# Patient Record
Sex: Female | Born: 1935 | Hispanic: Yes | Marital: Married | Smoking: Never smoker
Health system: Southern US, Community
[De-identification: ages and names within clinical notes are randomized; demographics above are authoritative.]

## PROBLEM LIST (undated history)

## (undated) DIAGNOSIS — M810 Age-related osteoporosis without current pathological fracture: Secondary | ICD-10-CM

## (undated) DIAGNOSIS — C50919 Malignant neoplasm of unspecified site of unspecified female breast: Secondary | ICD-10-CM

## (undated) DIAGNOSIS — M1990 Unspecified osteoarthritis, unspecified site: Secondary | ICD-10-CM

## (undated) DIAGNOSIS — I10 Essential (primary) hypertension: Secondary | ICD-10-CM

## (undated) HISTORY — DX: Age-related osteoporosis without current pathological fracture: M81.0

## (undated) HISTORY — DX: Unspecified osteoarthritis, unspecified site: M19.90

## (undated) HISTORY — DX: Malignant neoplasm of unspecified site of unspecified female breast: C50.919

---

## 2006-04-19 ENCOUNTER — Encounter: Admission: RE | Admit: 2006-04-19 | Discharge: 2006-04-19 | Payer: Self-pay | Admitting: Orthopedic Surgery

## 2007-03-24 ENCOUNTER — Other Ambulatory Visit: Admission: RE | Admit: 2007-03-24 | Discharge: 2007-03-24 | Payer: Self-pay | Admitting: Internal Medicine

## 2012-01-15 DIAGNOSIS — I10 Essential (primary) hypertension: Secondary | ICD-10-CM | POA: Diagnosis not present

## 2012-01-15 DIAGNOSIS — R5381 Other malaise: Secondary | ICD-10-CM | POA: Diagnosis not present

## 2012-01-15 DIAGNOSIS — R5383 Other fatigue: Secondary | ICD-10-CM | POA: Diagnosis not present

## 2012-01-22 DIAGNOSIS — M159 Polyosteoarthritis, unspecified: Secondary | ICD-10-CM | POA: Diagnosis not present

## 2012-01-22 DIAGNOSIS — I10 Essential (primary) hypertension: Secondary | ICD-10-CM | POA: Diagnosis not present

## 2012-01-22 DIAGNOSIS — M810 Age-related osteoporosis without current pathological fracture: Secondary | ICD-10-CM | POA: Diagnosis not present

## 2012-01-22 DIAGNOSIS — R5381 Other malaise: Secondary | ICD-10-CM | POA: Diagnosis not present

## 2012-04-25 DIAGNOSIS — H2510 Age-related nuclear cataract, unspecified eye: Secondary | ICD-10-CM | POA: Diagnosis not present

## 2012-04-25 DIAGNOSIS — H35039 Hypertensive retinopathy, unspecified eye: Secondary | ICD-10-CM | POA: Diagnosis not present

## 2012-04-25 DIAGNOSIS — H353190 Nonexudative age-related macular degeneration, unspecified eye, stage unspecified: Secondary | ICD-10-CM | POA: Diagnosis not present

## 2012-04-25 DIAGNOSIS — H43819 Vitreous degeneration, unspecified eye: Secondary | ICD-10-CM | POA: Diagnosis not present

## 2012-04-25 DIAGNOSIS — H40019 Open angle with borderline findings, low risk, unspecified eye: Secondary | ICD-10-CM | POA: Diagnosis not present

## 2012-07-15 DIAGNOSIS — M810 Age-related osteoporosis without current pathological fracture: Secondary | ICD-10-CM | POA: Diagnosis not present

## 2012-07-15 DIAGNOSIS — Z79899 Other long term (current) drug therapy: Secondary | ICD-10-CM | POA: Diagnosis not present

## 2012-07-15 DIAGNOSIS — R5383 Other fatigue: Secondary | ICD-10-CM | POA: Diagnosis not present

## 2012-07-15 DIAGNOSIS — R5381 Other malaise: Secondary | ICD-10-CM | POA: Diagnosis not present

## 2012-07-15 DIAGNOSIS — I10 Essential (primary) hypertension: Secondary | ICD-10-CM | POA: Diagnosis not present

## 2012-07-15 DIAGNOSIS — M159 Polyosteoarthritis, unspecified: Secondary | ICD-10-CM | POA: Diagnosis not present

## 2012-07-22 DIAGNOSIS — M810 Age-related osteoporosis without current pathological fracture: Secondary | ICD-10-CM | POA: Diagnosis not present

## 2012-07-22 DIAGNOSIS — Z780 Asymptomatic menopausal state: Secondary | ICD-10-CM | POA: Diagnosis not present

## 2012-07-22 DIAGNOSIS — M159 Polyosteoarthritis, unspecified: Secondary | ICD-10-CM | POA: Diagnosis not present

## 2012-07-22 DIAGNOSIS — I10 Essential (primary) hypertension: Secondary | ICD-10-CM | POA: Diagnosis not present

## 2012-07-22 DIAGNOSIS — H908 Mixed conductive and sensorineural hearing loss, unspecified: Secondary | ICD-10-CM | POA: Diagnosis not present

## 2012-08-13 DIAGNOSIS — Z1212 Encounter for screening for malignant neoplasm of rectum: Secondary | ICD-10-CM | POA: Diagnosis not present

## 2013-04-17 DIAGNOSIS — H40019 Open angle with borderline findings, low risk, unspecified eye: Secondary | ICD-10-CM | POA: Diagnosis not present

## 2013-04-17 DIAGNOSIS — H35039 Hypertensive retinopathy, unspecified eye: Secondary | ICD-10-CM | POA: Diagnosis not present

## 2013-04-17 DIAGNOSIS — H353190 Nonexudative age-related macular degeneration, unspecified eye, stage unspecified: Secondary | ICD-10-CM | POA: Diagnosis not present

## 2013-04-17 DIAGNOSIS — H2510 Age-related nuclear cataract, unspecified eye: Secondary | ICD-10-CM | POA: Diagnosis not present

## 2013-07-15 DIAGNOSIS — H04129 Dry eye syndrome of unspecified lacrimal gland: Secondary | ICD-10-CM | POA: Diagnosis not present

## 2013-07-15 DIAGNOSIS — H40019 Open angle with borderline findings, low risk, unspecified eye: Secondary | ICD-10-CM | POA: Diagnosis not present

## 2013-07-21 DIAGNOSIS — I10 Essential (primary) hypertension: Secondary | ICD-10-CM | POA: Diagnosis not present

## 2013-07-21 DIAGNOSIS — M159 Polyosteoarthritis, unspecified: Secondary | ICD-10-CM | POA: Diagnosis not present

## 2013-07-21 DIAGNOSIS — M899 Disorder of bone, unspecified: Secondary | ICD-10-CM | POA: Diagnosis not present

## 2013-07-21 DIAGNOSIS — M810 Age-related osteoporosis without current pathological fracture: Secondary | ICD-10-CM | POA: Diagnosis not present

## 2013-07-21 DIAGNOSIS — Z0000 Encounter for general adult medical examination without abnormal findings: Secondary | ICD-10-CM | POA: Diagnosis not present

## 2013-07-28 DIAGNOSIS — M810 Age-related osteoporosis without current pathological fracture: Secondary | ICD-10-CM | POA: Diagnosis not present

## 2013-07-28 DIAGNOSIS — I10 Essential (primary) hypertension: Secondary | ICD-10-CM | POA: Diagnosis not present

## 2013-07-28 DIAGNOSIS — M159 Polyosteoarthritis, unspecified: Secondary | ICD-10-CM | POA: Diagnosis not present

## 2013-07-28 DIAGNOSIS — R5381 Other malaise: Secondary | ICD-10-CM | POA: Diagnosis not present

## 2014-08-03 DIAGNOSIS — I10 Essential (primary) hypertension: Secondary | ICD-10-CM | POA: Diagnosis not present

## 2014-08-03 DIAGNOSIS — Z0000 Encounter for general adult medical examination without abnormal findings: Secondary | ICD-10-CM | POA: Diagnosis not present

## 2014-08-03 DIAGNOSIS — Z1389 Encounter for screening for other disorder: Secondary | ICD-10-CM | POA: Diagnosis not present

## 2014-08-03 DIAGNOSIS — R5383 Other fatigue: Secondary | ICD-10-CM | POA: Diagnosis not present

## 2014-08-10 DIAGNOSIS — I10 Essential (primary) hypertension: Secondary | ICD-10-CM | POA: Diagnosis not present

## 2014-08-10 DIAGNOSIS — M150 Primary generalized (osteo)arthritis: Secondary | ICD-10-CM | POA: Diagnosis not present

## 2014-08-10 DIAGNOSIS — Z780 Asymptomatic menopausal state: Secondary | ICD-10-CM | POA: Diagnosis not present

## 2014-08-10 DIAGNOSIS — N183 Chronic kidney disease, stage 3 (moderate): Secondary | ICD-10-CM | POA: Diagnosis not present

## 2014-11-08 DIAGNOSIS — H25011 Cortical age-related cataract, right eye: Secondary | ICD-10-CM | POA: Diagnosis not present

## 2014-11-08 DIAGNOSIS — H2512 Age-related nuclear cataract, left eye: Secondary | ICD-10-CM | POA: Diagnosis not present

## 2014-11-08 DIAGNOSIS — H2511 Age-related nuclear cataract, right eye: Secondary | ICD-10-CM | POA: Diagnosis not present

## 2014-11-08 DIAGNOSIS — H3531 Nonexudative age-related macular degeneration: Secondary | ICD-10-CM | POA: Diagnosis not present

## 2014-11-08 DIAGNOSIS — H40013 Open angle with borderline findings, low risk, bilateral: Secondary | ICD-10-CM | POA: Diagnosis not present

## 2015-05-11 DIAGNOSIS — H40013 Open angle with borderline findings, low risk, bilateral: Secondary | ICD-10-CM | POA: Diagnosis not present

## 2015-09-06 DIAGNOSIS — I10 Essential (primary) hypertension: Secondary | ICD-10-CM | POA: Diagnosis not present

## 2015-09-06 DIAGNOSIS — N183 Chronic kidney disease, stage 3 (moderate): Secondary | ICD-10-CM | POA: Diagnosis not present

## 2015-09-06 DIAGNOSIS — M150 Primary generalized (osteo)arthritis: Secondary | ICD-10-CM | POA: Diagnosis not present

## 2015-09-13 DIAGNOSIS — I10 Essential (primary) hypertension: Secondary | ICD-10-CM | POA: Diagnosis not present

## 2015-09-13 DIAGNOSIS — N183 Chronic kidney disease, stage 3 (moderate): Secondary | ICD-10-CM | POA: Diagnosis not present

## 2015-09-13 DIAGNOSIS — M810 Age-related osteoporosis without current pathological fracture: Secondary | ICD-10-CM | POA: Diagnosis not present

## 2015-09-13 DIAGNOSIS — M150 Primary generalized (osteo)arthritis: Secondary | ICD-10-CM | POA: Diagnosis not present

## 2016-01-17 DIAGNOSIS — H04123 Dry eye syndrome of bilateral lacrimal glands: Secondary | ICD-10-CM | POA: Diagnosis not present

## 2016-01-17 DIAGNOSIS — H353110 Nonexudative age-related macular degeneration, right eye, stage unspecified: Secondary | ICD-10-CM | POA: Diagnosis not present

## 2016-01-17 DIAGNOSIS — H353120 Nonexudative age-related macular degeneration, left eye, stage unspecified: Secondary | ICD-10-CM | POA: Diagnosis not present

## 2016-01-17 DIAGNOSIS — H40013 Open angle with borderline findings, low risk, bilateral: Secondary | ICD-10-CM | POA: Diagnosis not present

## 2016-10-05 ENCOUNTER — Emergency Department (HOSPITAL_COMMUNITY)
Admission: EM | Admit: 2016-10-05 | Discharge: 2016-10-05 | Disposition: A | Discharge status: Home / Self Care | Payer: Medicare Other | Attending: Emergency Medicine | Admitting: Emergency Medicine

## 2016-10-05 ENCOUNTER — Encounter (HOSPITAL_COMMUNITY): Payer: Self-pay | Admitting: *Deleted

## 2016-10-05 DIAGNOSIS — I10 Essential (primary) hypertension: Secondary | ICD-10-CM | POA: Diagnosis not present

## 2016-10-05 DIAGNOSIS — M545 Low back pain: Secondary | ICD-10-CM | POA: Diagnosis not present

## 2016-10-05 DIAGNOSIS — M2550 Pain in unspecified joint: Secondary | ICD-10-CM | POA: Diagnosis not present

## 2016-10-05 DIAGNOSIS — M79601 Pain in right arm: Secondary | ICD-10-CM | POA: Insufficient documentation

## 2016-10-05 DIAGNOSIS — M791 Myalgia: Secondary | ICD-10-CM | POA: Insufficient documentation

## 2016-10-05 HISTORY — DX: Essential (primary) hypertension: I10

## 2016-10-05 LAB — URINALYSIS, ROUTINE W REFLEX MICROSCOPIC
BILIRUBIN URINE: NEGATIVE
Glucose, UA: NEGATIVE mg/dL
HGB URINE DIPSTICK: NEGATIVE
Ketones, ur: NEGATIVE mg/dL
Leukocytes, UA: NEGATIVE
Nitrite: NEGATIVE
PH: 5 (ref 5.0–8.0)
Protein, ur: NEGATIVE mg/dL
SPECIFIC GRAVITY, URINE: 1.014 (ref 1.005–1.030)

## 2016-10-05 LAB — I-STAT CHEM 8, ED
BUN: 22 mg/dL — AB (ref 6–20)
CALCIUM ION: 1.32 mmol/L (ref 1.15–1.40)
CHLORIDE: 104 mmol/L (ref 101–111)
CREATININE: 0.9 mg/dL (ref 0.44–1.00)
Glucose, Bld: 116 mg/dL — ABNORMAL HIGH (ref 65–99)
HCT: 33 % — ABNORMAL LOW (ref 36.0–46.0)
Hemoglobin: 11.2 g/dL — ABNORMAL LOW (ref 12.0–15.0)
Potassium: 3.9 mmol/L (ref 3.5–5.1)
SODIUM: 138 mmol/L (ref 135–145)
TCO2: 23 mmol/L (ref 0–100)

## 2016-10-05 LAB — CK: Total CK: 98 U/L (ref 38–234)

## 2016-10-05 MED ORDER — TRAMADOL HCL 50 MG PO TABS: 50.0000 mg | ORAL_TABLET | Freq: Four times a day (QID) | ORAL | 0 refills | Status: DC | PRN

## 2016-10-05 MED ORDER — TRAMADOL HCL 50 MG PO TABS
50.0000 mg | ORAL_TABLET | Freq: Once | ORAL | Status: AC
  Administered 2016-10-05: 50 mg via ORAL
  Filled 2016-10-05: qty 1

## 2016-10-05 NOTE — ED Notes (Signed)
Patient requesting pain medication now. Made Dr Ethelda Chick aware. Awaiting new orders.

## 2016-10-05 NOTE — Discharge Instructions (Signed)
Take Tylenol for mild pain or the pain medicine prescribed for bad pain. Keep scheduled appointment with Dr.Ramachandran for 10/15/2016

## 2016-10-05 NOTE — ED Provider Notes (Signed)
[facility]-EMERGENCY DEPT Provider Note   CSN: [id number] Arrival date & time: 10/05/16  1527     History   Chief Complaint Chief Complaint  Patient presents with  . Arm Pain  . Leg Pain  . Back Pain    HPI Katelyn Fitzgerald is a 80 y.o. female.  HPI complains of pain of her entire right arm, low back pain and bilateral hip pain since mid November 2017. She is treated herself with Aspercreme and with Tylenol without adequate pain relief. She denies fever denies trauma denies chest pain denies shortness of breath denies other associated symptoms. She's never had similar symptoms before. Pain is worse with changing positions and improved with remaining still. No other associated symptoms  Past Medical History:  Diagnosis Date  . Hypertension     There are no active problems to display for this patient.   History reviewed. No pertinent surgical history.  OB History    No data available       Home Medications    Prior to Admission medications   Not on File    Family History No family history on file.  Social History Social History  Substance Use Topics  . Smoking status: Never Smoker  . Smokeless tobacco: Never Used  . Alcohol use No     Allergies   Patient has no allergy information on record.   Review of Systems Review of Systems  Constitutional: Negative.   HENT: Negative.   Respiratory: Negative.   Cardiovascular: Negative.   Gastrointestinal: Negative.   Musculoskeletal: Positive for arthralgias and myalgias.  Skin: Negative.   Neurological: Negative.   Psychiatric/Behavioral: Negative.   All other systems reviewed and are negative.    Physical Exam Updated Vital Signs BP 153/76   Pulse 95   Temp 98.2 F (36.8 C) (Oral)   Resp 16   SpO2 96%   Physical Exam  Constitutional: She appears well-developed and well-nourished.  HENT:  Head: Normocephalic and atraumatic.  Eyes: Conjunctivae are normal. Pupils are equal, round, and reactive to  light.  Neck: Neck supple. No tracheal deviation present. No thyromegaly present.  Cardiovascular: Normal rate and regular rhythm.   No murmur heard. Pulmonary/Chest: Effort normal and breath sounds normal.  Abdominal: Soft. Bowel sounds are normal. She exhibits no distension. There is no tenderness.  Musculoskeletal: Normal range of motion. She exhibits no edema or tenderness.  Tire spine is nontender. All 4 extremities without redness swelling or tenderness. Full range of motion neurovascularly intact  Neurological: She is alert. Coordination normal.  Gait normal motor strength 5 over 5 overall  Skin: Skin is warm and dry. No rash noted.  Psychiatric: She has a normal mood and affect.  Nursing note and vitals reviewed.    ED Treatments / Results  Labs (all labs ordered are listed, but only abnormal results are displayed) Labs Reviewed  URINALYSIS, ROUTINE W REFLEX MICROSCOPIC    EKG  EKG Interpretation None      Results for orders placed or performed during the hospital encounter of 10/05/16  Urinalysis, Routine w reflex microscopic- may I&O cath if menses  Result Value Ref Range   Color, Urine YELLOW YELLOW   APPearance CLEAR CLEAR   Specific Gravity, Urine 1.014 1.005 - 1.030   pH 5.0 5.0 - 8.0   Glucose, UA NEGATIVE NEGATIVE mg/dL   Hgb urine dipstick NEGATIVE NEGATIVE   Bilirubin Urine NEGATIVE NEGATIVE   Ketones, ur NEGATIVE NEGATIVE mg/dL   Protein, ur NEGATIVE NEGATIVE mg/dL  Nitrite NEGATIVE NEGATIVE   Leukocytes, UA NEGATIVE NEGATIVE  CK  Result Value Ref Range   Total CK 98 38 - 234 U/L  I-stat chem 8, ed  Result Value Ref Range   Sodium 138 135 - 145 mmol/L   Potassium 3.9 3.5 - 5.1 mmol/L   Chloride 104 101 - 111 mmol/L   BUN 22 (H) 6 - 20 mg/dL   Creatinine, Ser 8.65 0.44 - 1.00 mg/dL   Glucose, Bld 784 (H) 65 - 99 mg/dL   Calcium, Ion 6.96 2.95 - 1.40 mmol/L   TCO2 23 0 - 100 mmol/L   Hemoglobin 11.2 (L) 12.0 - 15.0 g/dL   HCT 28.4 (L) 13.2 -  46.0 %   No results found.  Radiology No results found.  Procedures Procedures (including critical care time)  Medications Ordered in ED Medications - No data to display   Initial Impression / Assessment and Plan / ED Course  I have reviewed the triage vital signs and the nursing notes.  Pertinent labs & imaging results that were available during my care of the patient were reviewed by me and considered in my medical decision making (see chart for details).  Clinical Course   8:10 PM she feels improved after treatment with tramadol. [place] controlled substance reporting system queried Plan prescription tramadol. She is to keep scheduled appointment with Dr.Ramachondron 10/15/2006  Final Clinical Impressions(s) / ED Diagnoses  Diagnosis #1 arthralgias #2 myalgias Final diagnoses:  None    New Prescriptions New Prescriptions   No medications on file     Doug Sou, MD 10/05/16 2017

## 2016-10-05 NOTE — ED Notes (Signed)
Patient requesting pharmacy tech to come enter medications to make sure nothing she takes will interfere with new pain medication ordered before she takes it.   Made Pharmacy techs aware.

## 2016-10-05 NOTE — ED Triage Notes (Signed)
Pt complains of right arm pain, lower back pain, and bilateral knee pain for the past month. Pt denies injury, states pain gradually became worse.

## 2016-10-09 DIAGNOSIS — D649 Anemia, unspecified: Secondary | ICD-10-CM | POA: Diagnosis not present

## 2016-10-09 DIAGNOSIS — I10 Essential (primary) hypertension: Secondary | ICD-10-CM | POA: Diagnosis not present

## 2016-10-09 DIAGNOSIS — N183 Chronic kidney disease, stage 3 (moderate): Secondary | ICD-10-CM | POA: Diagnosis not present

## 2016-10-09 DIAGNOSIS — M150 Primary generalized (osteo)arthritis: Secondary | ICD-10-CM | POA: Diagnosis not present

## 2016-10-12 DIAGNOSIS — D509 Iron deficiency anemia, unspecified: Secondary | ICD-10-CM | POA: Diagnosis not present

## 2016-10-12 DIAGNOSIS — D649 Anemia, unspecified: Secondary | ICD-10-CM | POA: Diagnosis not present

## 2016-10-15 ENCOUNTER — Other Ambulatory Visit: Payer: Self-pay | Admitting: Internal Medicine

## 2016-10-15 DIAGNOSIS — I10 Essential (primary) hypertension: Secondary | ICD-10-CM | POA: Diagnosis not present

## 2016-10-15 DIAGNOSIS — I63132 Cerebral infarction due to embolism of left carotid artery: Secondary | ICD-10-CM

## 2016-10-15 DIAGNOSIS — M25511 Pain in right shoulder: Secondary | ICD-10-CM | POA: Diagnosis not present

## 2016-10-15 DIAGNOSIS — D649 Anemia, unspecified: Secondary | ICD-10-CM | POA: Diagnosis not present

## 2016-10-15 DIAGNOSIS — N183 Chronic kidney disease, stage 3 (moderate): Secondary | ICD-10-CM | POA: Diagnosis not present

## 2016-10-16 ENCOUNTER — Ambulatory Visit
Admission: RE | Admit: 2016-10-16 | Discharge: 2016-10-16 | Disposition: A | Discharge status: Home / Self Care | Payer: Medicare Other | Source: Ambulatory Visit | Attending: Internal Medicine | Admitting: Internal Medicine

## 2016-10-16 DIAGNOSIS — I63132 Cerebral infarction due to embolism of left carotid artery: Secondary | ICD-10-CM

## 2016-10-16 DIAGNOSIS — I639 Cerebral infarction, unspecified: Secondary | ICD-10-CM | POA: Diagnosis not present

## 2016-10-22 DIAGNOSIS — Z1212 Encounter for screening for malignant neoplasm of rectum: Secondary | ICD-10-CM | POA: Diagnosis not present

## 2016-10-30 DIAGNOSIS — M25569 Pain in unspecified knee: Secondary | ICD-10-CM | POA: Diagnosis not present

## 2016-10-30 DIAGNOSIS — M1711 Unilateral primary osteoarthritis, right knee: Secondary | ICD-10-CM | POA: Diagnosis not present

## 2016-10-30 DIAGNOSIS — M1712 Unilateral primary osteoarthritis, left knee: Secondary | ICD-10-CM | POA: Diagnosis not present

## 2016-10-30 DIAGNOSIS — M25511 Pain in right shoulder: Secondary | ICD-10-CM | POA: Diagnosis not present

## 2016-10-30 DIAGNOSIS — D649 Anemia, unspecified: Secondary | ICD-10-CM | POA: Diagnosis not present

## 2016-10-31 DIAGNOSIS — M25569 Pain in unspecified knee: Secondary | ICD-10-CM | POA: Diagnosis not present

## 2016-11-14 DIAGNOSIS — M2550 Pain in unspecified joint: Secondary | ICD-10-CM | POA: Diagnosis not present

## 2016-11-22 DIAGNOSIS — M150 Primary generalized (osteo)arthritis: Secondary | ICD-10-CM | POA: Diagnosis not present

## 2016-11-22 DIAGNOSIS — R768 Other specified abnormal immunological findings in serum: Secondary | ICD-10-CM | POA: Diagnosis not present

## 2016-11-26 DIAGNOSIS — H04123 Dry eye syndrome of bilateral lacrimal glands: Secondary | ICD-10-CM | POA: Diagnosis not present

## 2016-11-26 DIAGNOSIS — H40013 Open angle with borderline findings, low risk, bilateral: Secondary | ICD-10-CM | POA: Diagnosis not present

## 2016-12-26 DIAGNOSIS — M064 Inflammatory polyarthropathy: Secondary | ICD-10-CM | POA: Diagnosis not present

## 2016-12-26 DIAGNOSIS — Z13828 Encounter for screening for other musculoskeletal disorder: Secondary | ICD-10-CM | POA: Diagnosis not present

## 2016-12-26 DIAGNOSIS — R768 Other specified abnormal immunological findings in serum: Secondary | ICD-10-CM | POA: Diagnosis not present

## 2016-12-26 DIAGNOSIS — M1990 Unspecified osteoarthritis, unspecified site: Secondary | ICD-10-CM | POA: Diagnosis not present

## 2016-12-26 DIAGNOSIS — M79642 Pain in left hand: Secondary | ICD-10-CM | POA: Diagnosis not present

## 2016-12-26 DIAGNOSIS — M79641 Pain in right hand: Secondary | ICD-10-CM | POA: Diagnosis not present

## 2016-12-26 DIAGNOSIS — M810 Age-related osteoporosis without current pathological fracture: Secondary | ICD-10-CM | POA: Diagnosis not present

## 2017-02-04 DIAGNOSIS — M810 Age-related osteoporosis without current pathological fracture: Secondary | ICD-10-CM | POA: Diagnosis not present

## 2017-02-04 DIAGNOSIS — M1990 Unspecified osteoarthritis, unspecified site: Secondary | ICD-10-CM | POA: Diagnosis not present

## 2017-02-04 DIAGNOSIS — R768 Other specified abnormal immunological findings in serum: Secondary | ICD-10-CM | POA: Diagnosis not present

## 2017-02-04 DIAGNOSIS — M064 Inflammatory polyarthropathy: Secondary | ICD-10-CM | POA: Diagnosis not present

## 2017-06-19 DIAGNOSIS — R768 Other specified abnormal immunological findings in serum: Secondary | ICD-10-CM | POA: Diagnosis not present

## 2017-06-19 DIAGNOSIS — M810 Age-related osteoporosis without current pathological fracture: Secondary | ICD-10-CM | POA: Diagnosis not present

## 2017-06-19 DIAGNOSIS — M1990 Unspecified osteoarthritis, unspecified site: Secondary | ICD-10-CM | POA: Diagnosis not present

## 2017-06-19 DIAGNOSIS — M064 Inflammatory polyarthropathy: Secondary | ICD-10-CM | POA: Diagnosis not present

## 2017-07-29 DIAGNOSIS — H353131 Nonexudative age-related macular degeneration, bilateral, early dry stage: Secondary | ICD-10-CM | POA: Diagnosis not present

## 2017-07-29 DIAGNOSIS — H25013 Cortical age-related cataract, bilateral: Secondary | ICD-10-CM | POA: Diagnosis not present

## 2017-07-29 DIAGNOSIS — H40013 Open angle with borderline findings, low risk, bilateral: Secondary | ICD-10-CM | POA: Diagnosis not present

## 2017-07-29 DIAGNOSIS — H2513 Age-related nuclear cataract, bilateral: Secondary | ICD-10-CM | POA: Diagnosis not present

## 2017-08-12 DIAGNOSIS — M810 Age-related osteoporosis without current pathological fracture: Secondary | ICD-10-CM | POA: Diagnosis not present

## 2017-09-24 DIAGNOSIS — M064 Inflammatory polyarthropathy: Secondary | ICD-10-CM | POA: Diagnosis not present

## 2017-09-24 DIAGNOSIS — R768 Other specified abnormal immunological findings in serum: Secondary | ICD-10-CM | POA: Diagnosis not present

## 2017-09-24 DIAGNOSIS — M810 Age-related osteoporosis without current pathological fracture: Secondary | ICD-10-CM | POA: Diagnosis not present

## 2017-09-24 DIAGNOSIS — M1990 Unspecified osteoarthritis, unspecified site: Secondary | ICD-10-CM | POA: Diagnosis not present

## 2017-09-24 DIAGNOSIS — M069 Rheumatoid arthritis, unspecified: Secondary | ICD-10-CM | POA: Diagnosis not present

## 2017-10-21 DIAGNOSIS — N183 Chronic kidney disease, stage 3 (moderate): Secondary | ICD-10-CM | POA: Diagnosis not present

## 2017-10-21 DIAGNOSIS — I10 Essential (primary) hypertension: Secondary | ICD-10-CM | POA: Diagnosis not present

## 2017-10-28 DIAGNOSIS — Z0000 Encounter for general adult medical examination without abnormal findings: Secondary | ICD-10-CM | POA: Diagnosis not present

## 2017-10-28 DIAGNOSIS — M810 Age-related osteoporosis without current pathological fracture: Secondary | ICD-10-CM | POA: Diagnosis not present

## 2017-10-28 DIAGNOSIS — I10 Essential (primary) hypertension: Secondary | ICD-10-CM | POA: Diagnosis not present

## 2017-10-28 DIAGNOSIS — M150 Primary generalized (osteo)arthritis: Secondary | ICD-10-CM | POA: Diagnosis not present

## 2017-10-28 DIAGNOSIS — M069 Rheumatoid arthritis, unspecified: Secondary | ICD-10-CM | POA: Diagnosis not present

## 2017-10-28 DIAGNOSIS — N183 Chronic kidney disease, stage 3 (moderate): Secondary | ICD-10-CM | POA: Diagnosis not present

## 2017-12-25 DIAGNOSIS — M069 Rheumatoid arthritis, unspecified: Secondary | ICD-10-CM | POA: Diagnosis not present

## 2017-12-25 DIAGNOSIS — M1990 Unspecified osteoarthritis, unspecified site: Secondary | ICD-10-CM | POA: Diagnosis not present

## 2017-12-25 DIAGNOSIS — M810 Age-related osteoporosis without current pathological fracture: Secondary | ICD-10-CM | POA: Diagnosis not present

## 2017-12-25 DIAGNOSIS — Z79899 Other long term (current) drug therapy: Secondary | ICD-10-CM | POA: Diagnosis not present

## 2017-12-25 DIAGNOSIS — N289 Disorder of kidney and ureter, unspecified: Secondary | ICD-10-CM | POA: Diagnosis not present

## 2017-12-25 DIAGNOSIS — M25512 Pain in left shoulder: Secondary | ICD-10-CM | POA: Diagnosis not present

## 2018-01-21 DIAGNOSIS — H40013 Open angle with borderline findings, low risk, bilateral: Secondary | ICD-10-CM | POA: Diagnosis not present

## 2018-02-13 DIAGNOSIS — M810 Age-related osteoporosis without current pathological fracture: Secondary | ICD-10-CM | POA: Diagnosis not present

## 2018-02-13 DIAGNOSIS — M069 Rheumatoid arthritis, unspecified: Secondary | ICD-10-CM | POA: Diagnosis not present

## 2018-03-25 DIAGNOSIS — N289 Disorder of kidney and ureter, unspecified: Secondary | ICD-10-CM | POA: Diagnosis not present

## 2018-03-25 DIAGNOSIS — M25512 Pain in left shoulder: Secondary | ICD-10-CM | POA: Diagnosis not present

## 2018-03-25 DIAGNOSIS — M1990 Unspecified osteoarthritis, unspecified site: Secondary | ICD-10-CM | POA: Diagnosis not present

## 2018-03-25 DIAGNOSIS — Z79899 Other long term (current) drug therapy: Secondary | ICD-10-CM | POA: Diagnosis not present

## 2018-03-25 DIAGNOSIS — M069 Rheumatoid arthritis, unspecified: Secondary | ICD-10-CM | POA: Diagnosis not present

## 2018-03-25 DIAGNOSIS — M810 Age-related osteoporosis without current pathological fracture: Secondary | ICD-10-CM | POA: Diagnosis not present

## 2018-05-13 DIAGNOSIS — Z79899 Other long term (current) drug therapy: Secondary | ICD-10-CM | POA: Diagnosis not present

## 2018-05-13 DIAGNOSIS — M069 Rheumatoid arthritis, unspecified: Secondary | ICD-10-CM | POA: Diagnosis not present

## 2018-06-18 IMAGING — CT CT HEAD W/O CM
3 of 4 series · 17 of 47 positions shown, 20 images · non-contrast
Comparison: None.

CLINICAL DATA: Cerebral infarction due to embolism left carotid
artery.

EXAM:
CT HEAD WITHOUT CONTRAST
TECHNIQUE: Contiguous axial images were obtained from the base of the skull
through the vertex without intravenous contrast.

[Series 32: 3d filtered head w/o · axial · non-contrast · 0.49mm/px · z∈[-25,+105]mm · 11 of 32 slices shown, 14 images]
[im 3/32  brain]
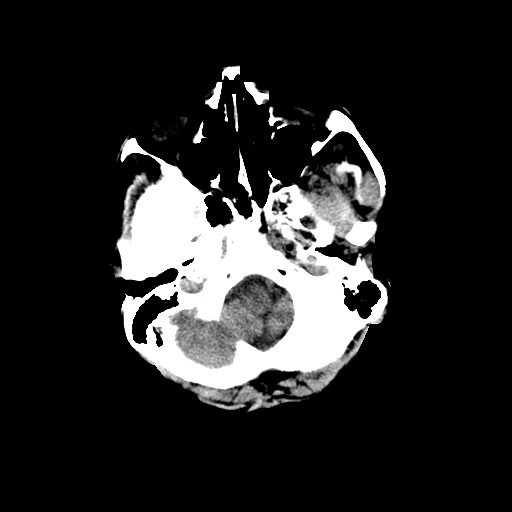
[im 3/32  bone]
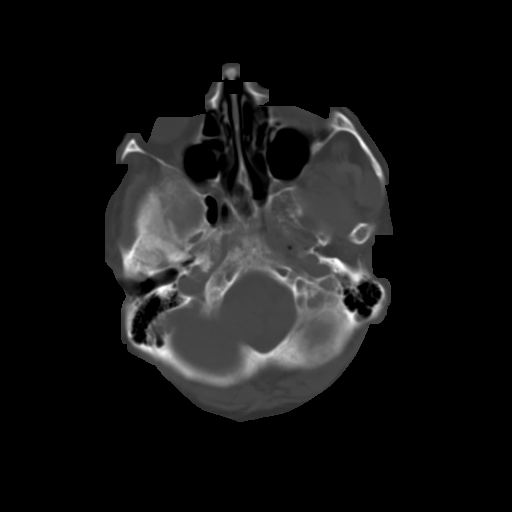
[im 5/32  brain]
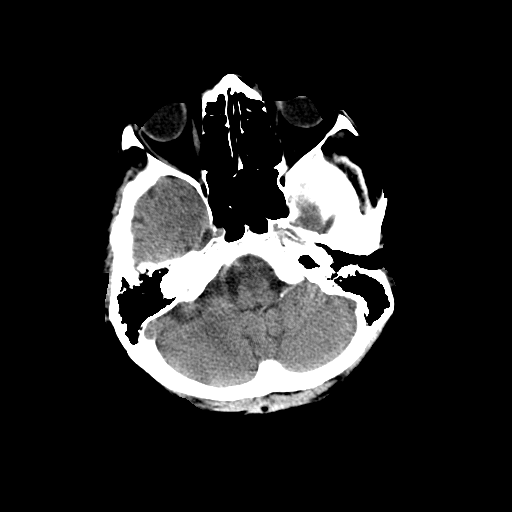
[im 7/32  brain]
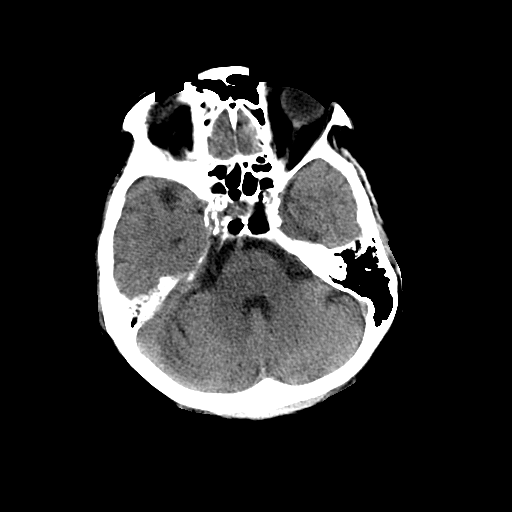
[im 12/32  brain]
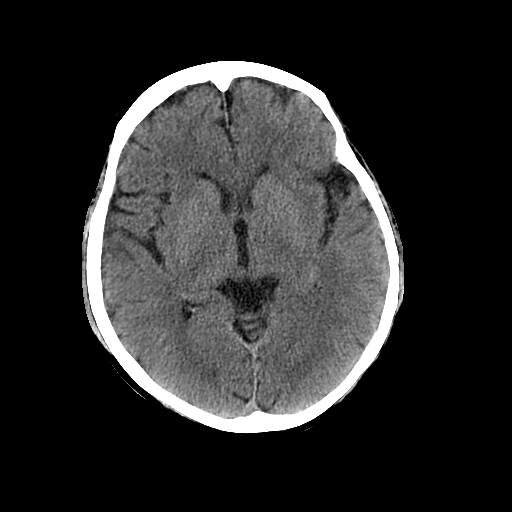
[im 14/32  brain]
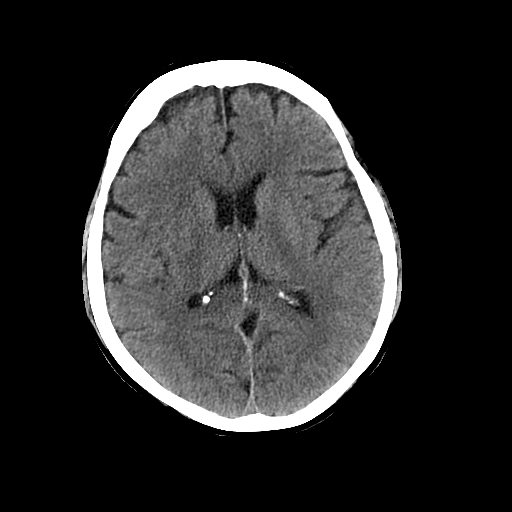
[im 14/32  bone]
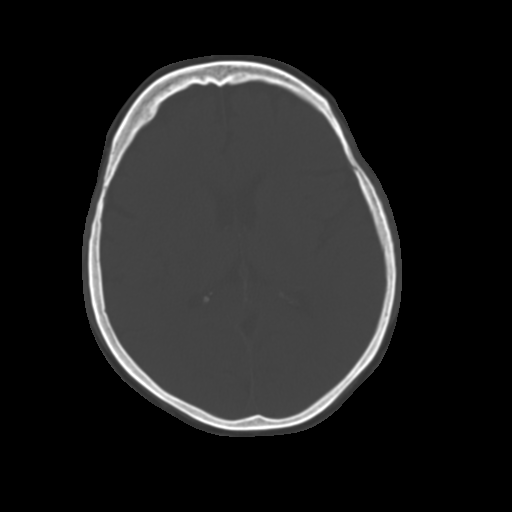
[im 16/32  brain]
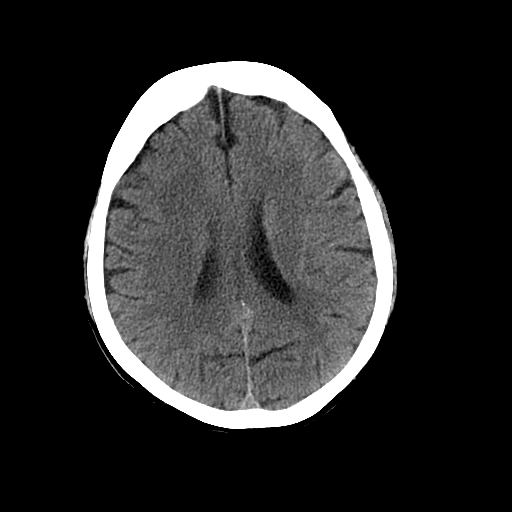
[im 18/32  brain]
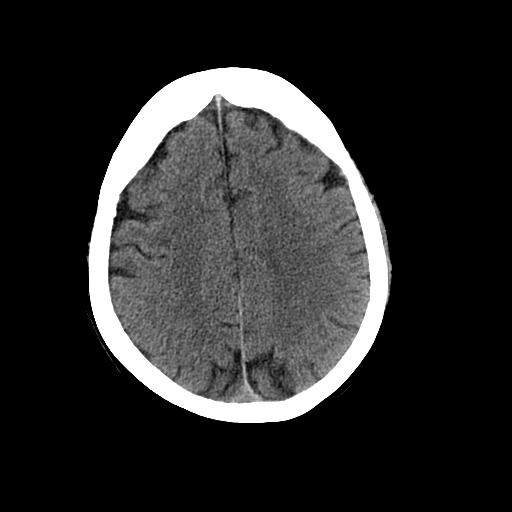
[im 20/32  brain]
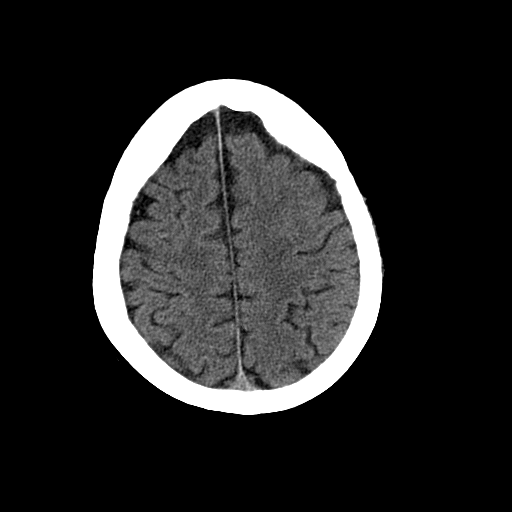
[im 25/32  brain]
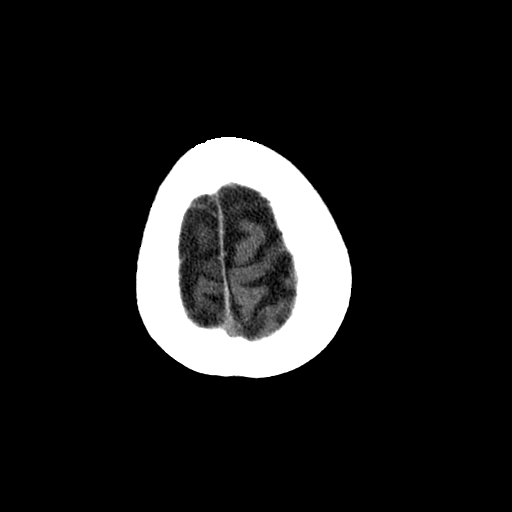
[im 25/32  bone]
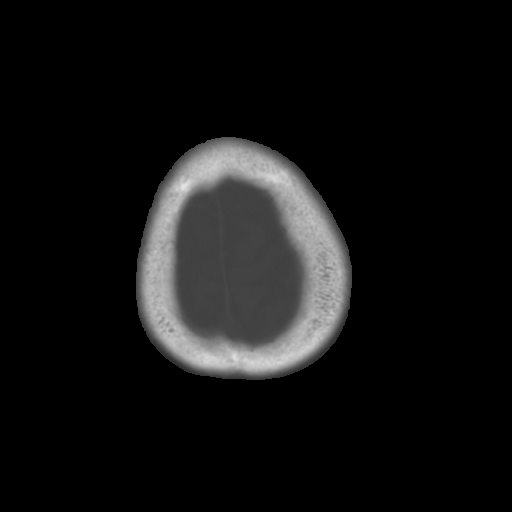
[im 27/32  brain]
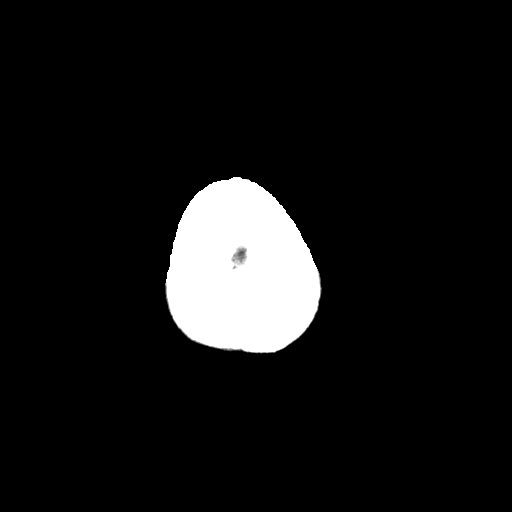
[im 29/32  brain]
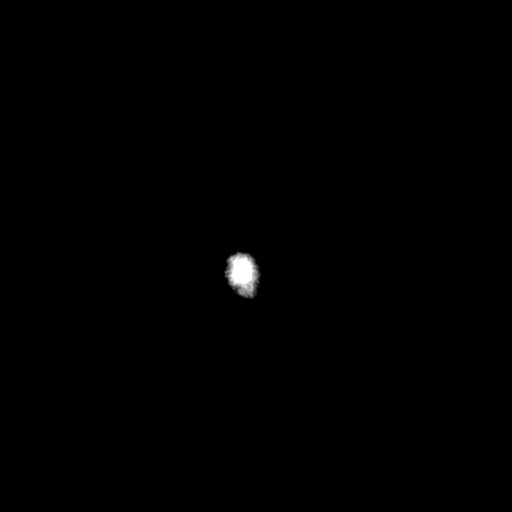

[Series 601: coronal brain · coronal · 0.49mm/px · 3 of 66 slices shown]
[im 22/66  brain]
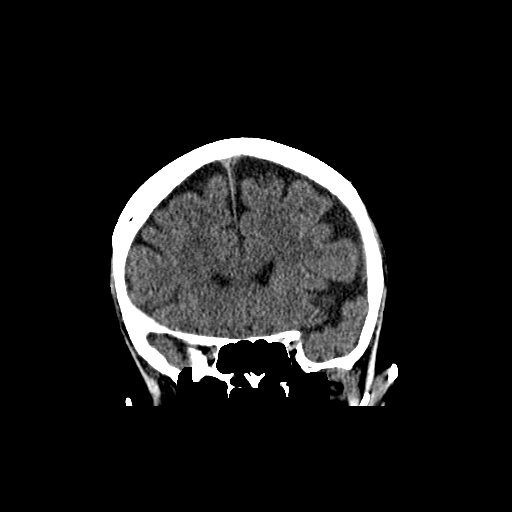
[im 29/66  brain]
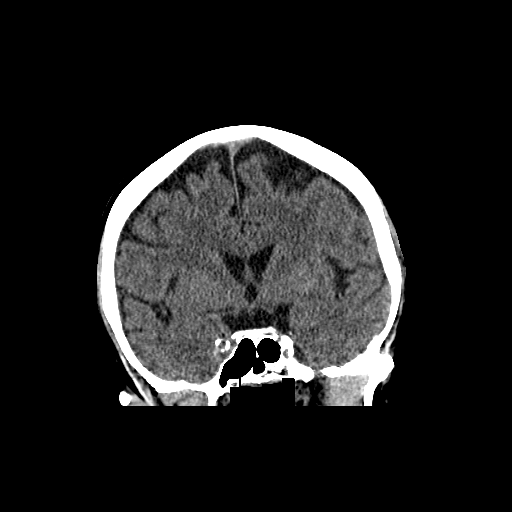
[im 37/66  brain]
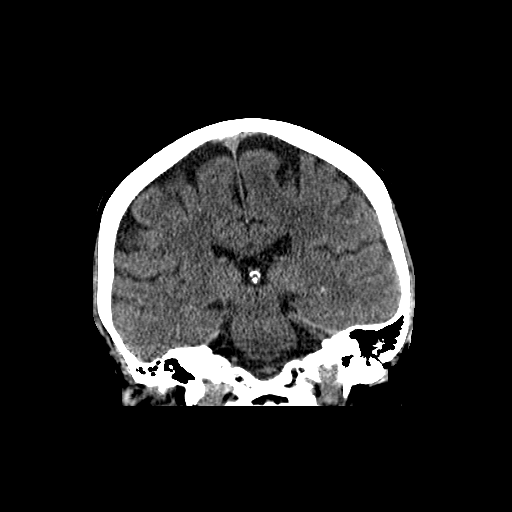

[Series 602: sagittal brain · sagittal · 0.49mm/px · 3 of 61 slices shown]
[im 21/61  brain]
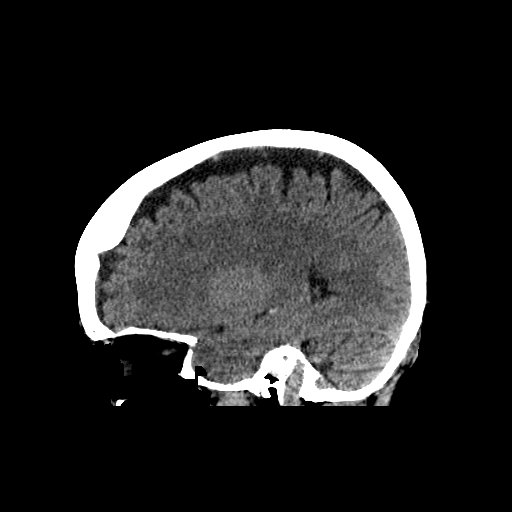
[im 31/61  brain]
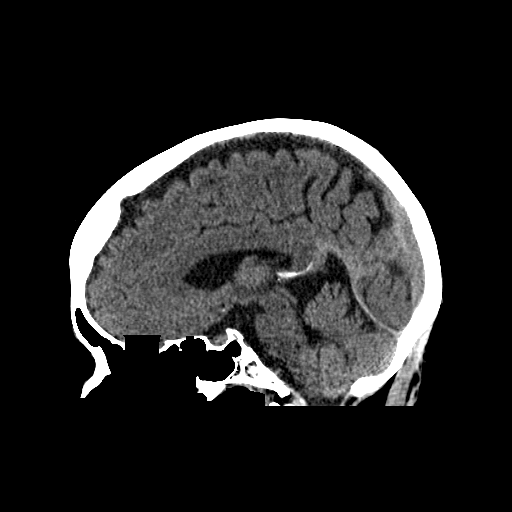
[im 41/61  brain]
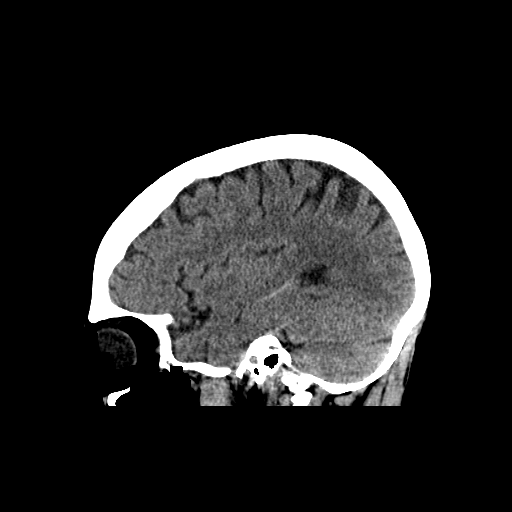

[17 of 47 positions shown; findings below may reference images not displayed]

FINDINGS: Brain: No evidence of acute infarction, hemorrhage, hydrocephalus,
extra-axial collection or mass lesion/mass effect.

Vascular: No hyperdense vessel or unexpected calcification.

Skull: Negative

Sinuses/Orbits: Negative

Other: None
IMPRESSION: Negative CT head.

## 2018-08-07 DIAGNOSIS — H25013 Cortical age-related cataract, bilateral: Secondary | ICD-10-CM | POA: Diagnosis not present

## 2018-08-07 DIAGNOSIS — H353131 Nonexudative age-related macular degeneration, bilateral, early dry stage: Secondary | ICD-10-CM | POA: Diagnosis not present

## 2018-08-07 DIAGNOSIS — H2513 Age-related nuclear cataract, bilateral: Secondary | ICD-10-CM | POA: Diagnosis not present

## 2018-08-07 DIAGNOSIS — H40013 Open angle with borderline findings, low risk, bilateral: Secondary | ICD-10-CM | POA: Diagnosis not present

## 2018-08-21 DIAGNOSIS — M810 Age-related osteoporosis without current pathological fracture: Secondary | ICD-10-CM | POA: Diagnosis not present

## 2018-08-25 DIAGNOSIS — M25512 Pain in left shoulder: Secondary | ICD-10-CM | POA: Diagnosis not present

## 2018-08-25 DIAGNOSIS — M069 Rheumatoid arthritis, unspecified: Secondary | ICD-10-CM | POA: Diagnosis not present

## 2018-08-25 DIAGNOSIS — N289 Disorder of kidney and ureter, unspecified: Secondary | ICD-10-CM | POA: Diagnosis not present

## 2018-08-25 DIAGNOSIS — M1990 Unspecified osteoarthritis, unspecified site: Secondary | ICD-10-CM | POA: Diagnosis not present

## 2018-08-25 DIAGNOSIS — M810 Age-related osteoporosis without current pathological fracture: Secondary | ICD-10-CM | POA: Diagnosis not present

## 2018-08-25 DIAGNOSIS — Z79899 Other long term (current) drug therapy: Secondary | ICD-10-CM | POA: Diagnosis not present

## 2018-11-10 DIAGNOSIS — I10 Essential (primary) hypertension: Secondary | ICD-10-CM | POA: Diagnosis not present

## 2018-11-10 DIAGNOSIS — M069 Rheumatoid arthritis, unspecified: Secondary | ICD-10-CM | POA: Diagnosis not present

## 2018-11-10 DIAGNOSIS — N183 Chronic kidney disease, stage 3 (moderate): Secondary | ICD-10-CM | POA: Diagnosis not present

## 2018-11-10 DIAGNOSIS — M150 Primary generalized (osteo)arthritis: Secondary | ICD-10-CM | POA: Diagnosis not present

## 2018-11-17 DIAGNOSIS — M069 Rheumatoid arthritis, unspecified: Secondary | ICD-10-CM | POA: Diagnosis not present

## 2018-11-17 DIAGNOSIS — M810 Age-related osteoporosis without current pathological fracture: Secondary | ICD-10-CM | POA: Diagnosis not present

## 2018-11-17 DIAGNOSIS — I129 Hypertensive chronic kidney disease with stage 1 through stage 4 chronic kidney disease, or unspecified chronic kidney disease: Secondary | ICD-10-CM | POA: Diagnosis not present

## 2018-11-17 DIAGNOSIS — Z0000 Encounter for general adult medical examination without abnormal findings: Secondary | ICD-10-CM | POA: Diagnosis not present

## 2018-11-17 DIAGNOSIS — M150 Primary generalized (osteo)arthritis: Secondary | ICD-10-CM | POA: Diagnosis not present

## 2018-11-17 DIAGNOSIS — N183 Chronic kidney disease, stage 3 (moderate): Secondary | ICD-10-CM | POA: Diagnosis not present

## 2019-02-17 DIAGNOSIS — H40013 Open angle with borderline findings, low risk, bilateral: Secondary | ICD-10-CM | POA: Diagnosis not present

## 2019-03-03 DIAGNOSIS — M069 Rheumatoid arthritis, unspecified: Secondary | ICD-10-CM | POA: Diagnosis not present

## 2019-03-03 DIAGNOSIS — N289 Disorder of kidney and ureter, unspecified: Secondary | ICD-10-CM | POA: Diagnosis not present

## 2019-03-03 DIAGNOSIS — Z79899 Other long term (current) drug therapy: Secondary | ICD-10-CM | POA: Diagnosis not present

## 2019-03-03 DIAGNOSIS — M1990 Unspecified osteoarthritis, unspecified site: Secondary | ICD-10-CM | POA: Diagnosis not present

## 2019-03-03 DIAGNOSIS — M810 Age-related osteoporosis without current pathological fracture: Secondary | ICD-10-CM | POA: Diagnosis not present

## 2019-04-09 DIAGNOSIS — L720 Epidermal cyst: Secondary | ICD-10-CM | POA: Diagnosis not present

## 2019-04-17 DIAGNOSIS — Z4802 Encounter for removal of sutures: Secondary | ICD-10-CM | POA: Diagnosis not present

## 2019-05-07 ENCOUNTER — Other Ambulatory Visit: Payer: Self-pay

## 2019-06-03 DIAGNOSIS — Z79899 Other long term (current) drug therapy: Secondary | ICD-10-CM | POA: Diagnosis not present

## 2019-06-03 DIAGNOSIS — M069 Rheumatoid arthritis, unspecified: Secondary | ICD-10-CM | POA: Diagnosis not present

## 2019-08-10 DIAGNOSIS — H25013 Cortical age-related cataract, bilateral: Secondary | ICD-10-CM | POA: Diagnosis not present

## 2019-08-10 DIAGNOSIS — H2513 Age-related nuclear cataract, bilateral: Secondary | ICD-10-CM | POA: Diagnosis not present

## 2019-08-10 DIAGNOSIS — H353131 Nonexudative age-related macular degeneration, bilateral, early dry stage: Secondary | ICD-10-CM | POA: Diagnosis not present

## 2019-08-10 DIAGNOSIS — H40013 Open angle with borderline findings, low risk, bilateral: Secondary | ICD-10-CM | POA: Diagnosis not present

## 2019-09-08 DIAGNOSIS — M069 Rheumatoid arthritis, unspecified: Secondary | ICD-10-CM | POA: Diagnosis not present

## 2019-09-08 DIAGNOSIS — N289 Disorder of kidney and ureter, unspecified: Secondary | ICD-10-CM | POA: Diagnosis not present

## 2019-09-08 DIAGNOSIS — R42 Dizziness and giddiness: Secondary | ICD-10-CM | POA: Diagnosis not present

## 2019-09-08 DIAGNOSIS — M1990 Unspecified osteoarthritis, unspecified site: Secondary | ICD-10-CM | POA: Diagnosis not present

## 2019-09-08 DIAGNOSIS — Z79899 Other long term (current) drug therapy: Secondary | ICD-10-CM | POA: Diagnosis not present

## 2019-09-08 DIAGNOSIS — M810 Age-related osteoporosis without current pathological fracture: Secondary | ICD-10-CM | POA: Diagnosis not present

## 2019-11-16 DIAGNOSIS — N183 Chronic kidney disease, stage 3 unspecified: Secondary | ICD-10-CM | POA: Diagnosis not present

## 2019-11-16 DIAGNOSIS — M810 Age-related osteoporosis without current pathological fracture: Secondary | ICD-10-CM | POA: Diagnosis not present

## 2019-11-16 DIAGNOSIS — I129 Hypertensive chronic kidney disease with stage 1 through stage 4 chronic kidney disease, or unspecified chronic kidney disease: Secondary | ICD-10-CM | POA: Diagnosis not present

## 2019-11-16 DIAGNOSIS — I10 Essential (primary) hypertension: Secondary | ICD-10-CM | POA: Diagnosis not present

## 2019-11-16 DIAGNOSIS — M069 Rheumatoid arthritis, unspecified: Secondary | ICD-10-CM | POA: Diagnosis not present

## 2019-11-16 DIAGNOSIS — M150 Primary generalized (osteo)arthritis: Secondary | ICD-10-CM | POA: Diagnosis not present

## 2019-11-23 DIAGNOSIS — M810 Age-related osteoporosis without current pathological fracture: Secondary | ICD-10-CM | POA: Diagnosis not present

## 2019-11-23 DIAGNOSIS — M150 Primary generalized (osteo)arthritis: Secondary | ICD-10-CM | POA: Diagnosis not present

## 2019-11-23 DIAGNOSIS — M069 Rheumatoid arthritis, unspecified: Secondary | ICD-10-CM | POA: Diagnosis not present

## 2019-11-23 DIAGNOSIS — I129 Hypertensive chronic kidney disease with stage 1 through stage 4 chronic kidney disease, or unspecified chronic kidney disease: Secondary | ICD-10-CM | POA: Diagnosis not present

## 2019-11-23 DIAGNOSIS — N1831 Chronic kidney disease, stage 3a: Secondary | ICD-10-CM | POA: Diagnosis not present

## 2019-11-23 DIAGNOSIS — Z0000 Encounter for general adult medical examination without abnormal findings: Secondary | ICD-10-CM | POA: Diagnosis not present

## 2019-11-23 DIAGNOSIS — I10 Essential (primary) hypertension: Secondary | ICD-10-CM | POA: Diagnosis not present

## 2019-12-06 ENCOUNTER — Ambulatory Visit: Payer: Medicare Other | Attending: Internal Medicine

## 2019-12-06 DIAGNOSIS — Z23 Encounter for immunization: Secondary | ICD-10-CM | POA: Insufficient documentation

## 2019-12-06 NOTE — Progress Notes (Signed)
   Covid-19 Vaccination Clinic  Name:  Katelyn Fitzgerald    MRN: [id number] DOB: 06-28-1936  12/06/2019  [name] was observed post Covid-19 immunization for 15 minutes without incidence. She was provided with Vaccine Information Sheet and instruction to access the V-Safe system.   [name] was instructed to call 911 with any severe reactions post vaccine: Marland Kitchen Difficulty breathing  . Swelling of your face and throat  . A fast heartbeat  . A bad rash all over your body  . Dizziness and weakness    Immunizations Administered    Name Date Dose VIS Date Route   Pfizer COVID-19 Vaccine 12/06/2019  1:25 PM 0.3 mL 09/18/2019 Intramuscular   Manufacturer: [employer], [employer]   Lot: [id number]   NDC: [address]-1

## 2019-12-30 DIAGNOSIS — I951 Orthostatic hypotension: Secondary | ICD-10-CM | POA: Diagnosis not present

## 2020-01-05 ENCOUNTER — Ambulatory Visit: Payer: Medicare Other | Attending: Internal Medicine

## 2020-01-05 DIAGNOSIS — Z23 Encounter for immunization: Secondary | ICD-10-CM

## 2020-01-05 NOTE — Progress Notes (Signed)
   Covid-19 Vaccination Clinic  Name:  Katelyn Fitzgerald    MRN: [id number] DOB: 1936/09/02  01/05/2020  Katelyn Fitzgerald was observed post Covid-19 immunization for 15 minutes without incident. She was provided with Vaccine Information Sheet and instruction to access the V-Safe system.   Katelyn Fitzgerald was instructed to call 911 with any severe reactions post vaccine: Marland Kitchen Difficulty breathing  . Swelling of face and throat  . A fast heartbeat  . A bad rash all over body  . Dizziness and weakness   Immunizations Administered    Name Date Dose VIS Date Route   Pfizer COVID-19 Vaccine 01/05/2020  8:34 AM 0.3 mL 09/18/2019 Intramuscular   Manufacturer: [employer], [employer]   Lot: [id number]   NDC: [address]-4

## 2020-02-11 DIAGNOSIS — H40013 Open angle with borderline findings, low risk, bilateral: Secondary | ICD-10-CM | POA: Diagnosis not present

## 2020-02-11 DIAGNOSIS — H40033 Anatomical narrow angle, bilateral: Secondary | ICD-10-CM | POA: Diagnosis not present

## 2020-02-29 DIAGNOSIS — I10 Essential (primary) hypertension: Secondary | ICD-10-CM | POA: Diagnosis not present

## 2020-02-29 DIAGNOSIS — N1831 Chronic kidney disease, stage 3a: Secondary | ICD-10-CM | POA: Diagnosis not present

## 2020-03-09 DIAGNOSIS — M79672 Pain in left foot: Secondary | ICD-10-CM | POA: Diagnosis not present

## 2020-03-09 DIAGNOSIS — M25572 Pain in left ankle and joints of left foot: Secondary | ICD-10-CM | POA: Diagnosis not present

## 2020-03-09 DIAGNOSIS — M069 Rheumatoid arthritis, unspecified: Secondary | ICD-10-CM | POA: Diagnosis not present

## 2020-03-09 DIAGNOSIS — M79641 Pain in right hand: Secondary | ICD-10-CM | POA: Diagnosis not present

## 2020-03-09 DIAGNOSIS — M79671 Pain in right foot: Secondary | ICD-10-CM | POA: Diagnosis not present

## 2020-03-09 DIAGNOSIS — M810 Age-related osteoporosis without current pathological fracture: Secondary | ICD-10-CM | POA: Diagnosis not present

## 2020-03-09 DIAGNOSIS — N289 Disorder of kidney and ureter, unspecified: Secondary | ICD-10-CM | POA: Diagnosis not present

## 2020-03-09 DIAGNOSIS — M1990 Unspecified osteoarthritis, unspecified site: Secondary | ICD-10-CM | POA: Diagnosis not present

## 2020-03-09 DIAGNOSIS — M79642 Pain in left hand: Secondary | ICD-10-CM | POA: Diagnosis not present

## 2020-03-09 DIAGNOSIS — R42 Dizziness and giddiness: Secondary | ICD-10-CM | POA: Diagnosis not present

## 2020-03-09 DIAGNOSIS — M25571 Pain in right ankle and joints of right foot: Secondary | ICD-10-CM | POA: Diagnosis not present

## 2020-03-09 DIAGNOSIS — Z79899 Other long term (current) drug therapy: Secondary | ICD-10-CM | POA: Diagnosis not present

## 2020-06-09 DIAGNOSIS — Z79899 Other long term (current) drug therapy: Secondary | ICD-10-CM | POA: Diagnosis not present

## 2020-06-09 DIAGNOSIS — M069 Rheumatoid arthritis, unspecified: Secondary | ICD-10-CM | POA: Diagnosis not present

## 2020-07-09 DIAGNOSIS — Z23 Encounter for immunization: Secondary | ICD-10-CM | POA: Diagnosis not present

## 2020-08-15 DIAGNOSIS — H25013 Cortical age-related cataract, bilateral: Secondary | ICD-10-CM | POA: Diagnosis not present

## 2020-08-15 DIAGNOSIS — H40013 Open angle with borderline findings, low risk, bilateral: Secondary | ICD-10-CM | POA: Diagnosis not present

## 2020-08-15 DIAGNOSIS — H2513 Age-related nuclear cataract, bilateral: Secondary | ICD-10-CM | POA: Diagnosis not present

## 2020-08-15 DIAGNOSIS — H40033 Anatomical narrow angle, bilateral: Secondary | ICD-10-CM | POA: Diagnosis not present

## 2020-09-13 DIAGNOSIS — M810 Age-related osteoporosis without current pathological fracture: Secondary | ICD-10-CM | POA: Diagnosis not present

## 2020-09-27 DIAGNOSIS — Z79899 Other long term (current) drug therapy: Secondary | ICD-10-CM | POA: Diagnosis not present

## 2020-09-27 DIAGNOSIS — M069 Rheumatoid arthritis, unspecified: Secondary | ICD-10-CM | POA: Diagnosis not present

## 2020-09-27 DIAGNOSIS — M1990 Unspecified osteoarthritis, unspecified site: Secondary | ICD-10-CM | POA: Diagnosis not present

## 2020-09-27 DIAGNOSIS — N289 Disorder of kidney and ureter, unspecified: Secondary | ICD-10-CM | POA: Diagnosis not present

## 2020-09-27 DIAGNOSIS — M810 Age-related osteoporosis without current pathological fracture: Secondary | ICD-10-CM | POA: Diagnosis not present

## 2020-11-16 DIAGNOSIS — I10 Essential (primary) hypertension: Secondary | ICD-10-CM | POA: Diagnosis not present

## 2020-11-16 DIAGNOSIS — M069 Rheumatoid arthritis, unspecified: Secondary | ICD-10-CM | POA: Diagnosis not present

## 2020-11-16 DIAGNOSIS — N1831 Chronic kidney disease, stage 3a: Secondary | ICD-10-CM | POA: Diagnosis not present

## 2020-11-16 DIAGNOSIS — I129 Hypertensive chronic kidney disease with stage 1 through stage 4 chronic kidney disease, or unspecified chronic kidney disease: Secondary | ICD-10-CM | POA: Diagnosis not present

## 2020-11-28 DIAGNOSIS — I129 Hypertensive chronic kidney disease with stage 1 through stage 4 chronic kidney disease, or unspecified chronic kidney disease: Secondary | ICD-10-CM | POA: Diagnosis not present

## 2020-11-28 DIAGNOSIS — Z0000 Encounter for general adult medical examination without abnormal findings: Secondary | ICD-10-CM | POA: Diagnosis not present

## 2020-11-28 DIAGNOSIS — N1831 Chronic kidney disease, stage 3a: Secondary | ICD-10-CM | POA: Diagnosis not present

## 2020-11-28 DIAGNOSIS — M0579 Rheumatoid arthritis with rheumatoid factor of multiple sites without organ or systems involvement: Secondary | ICD-10-CM | POA: Diagnosis not present

## 2020-11-28 DIAGNOSIS — M150 Primary generalized (osteo)arthritis: Secondary | ICD-10-CM | POA: Diagnosis not present

## 2020-11-28 DIAGNOSIS — I10 Essential (primary) hypertension: Secondary | ICD-10-CM | POA: Diagnosis not present

## 2020-11-28 DIAGNOSIS — M810 Age-related osteoporosis without current pathological fracture: Secondary | ICD-10-CM | POA: Diagnosis not present

## 2021-02-07 DIAGNOSIS — M150 Primary generalized (osteo)arthritis: Secondary | ICD-10-CM | POA: Diagnosis not present

## 2021-02-07 DIAGNOSIS — M1990 Unspecified osteoarthritis, unspecified site: Secondary | ICD-10-CM | POA: Diagnosis not present

## 2021-02-07 DIAGNOSIS — M810 Age-related osteoporosis without current pathological fracture: Secondary | ICD-10-CM | POA: Diagnosis not present

## 2021-02-07 DIAGNOSIS — Z79899 Other long term (current) drug therapy: Secondary | ICD-10-CM | POA: Diagnosis not present

## 2021-02-07 DIAGNOSIS — I10 Essential (primary) hypertension: Secondary | ICD-10-CM | POA: Diagnosis not present

## 2021-02-07 DIAGNOSIS — N289 Disorder of kidney and ureter, unspecified: Secondary | ICD-10-CM | POA: Diagnosis not present

## 2021-02-07 DIAGNOSIS — I129 Hypertensive chronic kidney disease with stage 1 through stage 4 chronic kidney disease, or unspecified chronic kidney disease: Secondary | ICD-10-CM | POA: Diagnosis not present

## 2021-02-07 DIAGNOSIS — M0579 Rheumatoid arthritis with rheumatoid factor of multiple sites without organ or systems involvement: Secondary | ICD-10-CM | POA: Diagnosis not present

## 2021-02-07 DIAGNOSIS — N1831 Chronic kidney disease, stage 3a: Secondary | ICD-10-CM | POA: Diagnosis not present

## 2021-02-07 DIAGNOSIS — M069 Rheumatoid arthritis, unspecified: Secondary | ICD-10-CM | POA: Diagnosis not present

## 2021-02-14 DIAGNOSIS — H40013 Open angle with borderline findings, low risk, bilateral: Secondary | ICD-10-CM | POA: Diagnosis not present

## 2021-02-14 DIAGNOSIS — H40033 Anatomical narrow angle, bilateral: Secondary | ICD-10-CM | POA: Diagnosis not present

## 2021-03-16 DIAGNOSIS — M810 Age-related osteoporosis without current pathological fracture: Secondary | ICD-10-CM | POA: Diagnosis not present

## 2021-05-11 DIAGNOSIS — Z79899 Other long term (current) drug therapy: Secondary | ICD-10-CM | POA: Diagnosis not present

## 2021-05-11 DIAGNOSIS — M810 Age-related osteoporosis without current pathological fracture: Secondary | ICD-10-CM | POA: Diagnosis not present

## 2021-05-11 DIAGNOSIS — M150 Primary generalized (osteo)arthritis: Secondary | ICD-10-CM | POA: Diagnosis not present

## 2021-08-10 DIAGNOSIS — M150 Primary generalized (osteo)arthritis: Secondary | ICD-10-CM | POA: Diagnosis not present

## 2021-08-10 DIAGNOSIS — M069 Rheumatoid arthritis, unspecified: Secondary | ICD-10-CM | POA: Diagnosis not present

## 2021-08-10 DIAGNOSIS — N289 Disorder of kidney and ureter, unspecified: Secondary | ICD-10-CM | POA: Diagnosis not present

## 2021-08-10 DIAGNOSIS — I129 Hypertensive chronic kidney disease with stage 1 through stage 4 chronic kidney disease, or unspecified chronic kidney disease: Secondary | ICD-10-CM | POA: Diagnosis not present

## 2021-08-10 DIAGNOSIS — M810 Age-related osteoporosis without current pathological fracture: Secondary | ICD-10-CM | POA: Diagnosis not present

## 2021-08-10 DIAGNOSIS — Z79899 Other long term (current) drug therapy: Secondary | ICD-10-CM | POA: Diagnosis not present

## 2021-08-10 DIAGNOSIS — N1831 Chronic kidney disease, stage 3a: Secondary | ICD-10-CM | POA: Diagnosis not present

## 2021-08-10 DIAGNOSIS — M1990 Unspecified osteoarthritis, unspecified site: Secondary | ICD-10-CM | POA: Diagnosis not present

## 2021-08-10 DIAGNOSIS — M0579 Rheumatoid arthritis with rheumatoid factor of multiple sites without organ or systems involvement: Secondary | ICD-10-CM | POA: Diagnosis not present

## 2021-08-10 DIAGNOSIS — I10 Essential (primary) hypertension: Secondary | ICD-10-CM | POA: Diagnosis not present

## 2021-08-15 DIAGNOSIS — H353131 Nonexudative age-related macular degeneration, bilateral, early dry stage: Secondary | ICD-10-CM | POA: Diagnosis not present

## 2021-08-15 DIAGNOSIS — H25013 Cortical age-related cataract, bilateral: Secondary | ICD-10-CM | POA: Diagnosis not present

## 2021-08-15 DIAGNOSIS — H2513 Age-related nuclear cataract, bilateral: Secondary | ICD-10-CM | POA: Diagnosis not present

## 2021-08-15 DIAGNOSIS — H40013 Open angle with borderline findings, low risk, bilateral: Secondary | ICD-10-CM | POA: Diagnosis not present

## 2021-09-18 DIAGNOSIS — M810 Age-related osteoporosis without current pathological fracture: Secondary | ICD-10-CM | POA: Diagnosis not present

## 2021-09-18 DIAGNOSIS — Z79899 Other long term (current) drug therapy: Secondary | ICD-10-CM | POA: Diagnosis not present

## 2021-12-11 DIAGNOSIS — I10 Essential (primary) hypertension: Secondary | ICD-10-CM | POA: Diagnosis not present

## 2021-12-11 DIAGNOSIS — R5383 Other fatigue: Secondary | ICD-10-CM | POA: Diagnosis not present

## 2021-12-18 DIAGNOSIS — M810 Age-related osteoporosis without current pathological fracture: Secondary | ICD-10-CM | POA: Diagnosis not present

## 2021-12-18 DIAGNOSIS — I10 Essential (primary) hypertension: Secondary | ICD-10-CM | POA: Diagnosis not present

## 2021-12-18 DIAGNOSIS — M0579 Rheumatoid arthritis with rheumatoid factor of multiple sites without organ or systems involvement: Secondary | ICD-10-CM | POA: Diagnosis not present

## 2021-12-18 DIAGNOSIS — I129 Hypertensive chronic kidney disease with stage 1 through stage 4 chronic kidney disease, or unspecified chronic kidney disease: Secondary | ICD-10-CM | POA: Diagnosis not present

## 2021-12-18 DIAGNOSIS — Z0000 Encounter for general adult medical examination without abnormal findings: Secondary | ICD-10-CM | POA: Diagnosis not present

## 2021-12-18 DIAGNOSIS — M150 Primary generalized (osteo)arthritis: Secondary | ICD-10-CM | POA: Diagnosis not present

## 2021-12-18 DIAGNOSIS — N1831 Chronic kidney disease, stage 3a: Secondary | ICD-10-CM | POA: Diagnosis not present

## 2022-03-22 DIAGNOSIS — M810 Age-related osteoporosis without current pathological fracture: Secondary | ICD-10-CM | POA: Diagnosis not present

## 2022-04-04 DIAGNOSIS — M150 Primary generalized (osteo)arthritis: Secondary | ICD-10-CM | POA: Diagnosis not present

## 2022-04-04 DIAGNOSIS — I10 Essential (primary) hypertension: Secondary | ICD-10-CM | POA: Diagnosis not present

## 2022-04-04 DIAGNOSIS — I129 Hypertensive chronic kidney disease with stage 1 through stage 4 chronic kidney disease, or unspecified chronic kidney disease: Secondary | ICD-10-CM | POA: Diagnosis not present

## 2022-04-04 DIAGNOSIS — M1990 Unspecified osteoarthritis, unspecified site: Secondary | ICD-10-CM | POA: Diagnosis not present

## 2022-04-04 DIAGNOSIS — Z79899 Other long term (current) drug therapy: Secondary | ICD-10-CM | POA: Diagnosis not present

## 2022-04-04 DIAGNOSIS — N289 Disorder of kidney and ureter, unspecified: Secondary | ICD-10-CM | POA: Diagnosis not present

## 2022-04-04 DIAGNOSIS — M25561 Pain in right knee: Secondary | ICD-10-CM | POA: Diagnosis not present

## 2022-04-04 DIAGNOSIS — M0579 Rheumatoid arthritis with rheumatoid factor of multiple sites without organ or systems involvement: Secondary | ICD-10-CM | POA: Diagnosis not present

## 2022-04-04 DIAGNOSIS — N1831 Chronic kidney disease, stage 3a: Secondary | ICD-10-CM | POA: Diagnosis not present

## 2022-04-04 DIAGNOSIS — M069 Rheumatoid arthritis, unspecified: Secondary | ICD-10-CM | POA: Diagnosis not present

## 2022-04-04 DIAGNOSIS — M810 Age-related osteoporosis without current pathological fracture: Secondary | ICD-10-CM | POA: Diagnosis not present

## 2022-07-02 DIAGNOSIS — M810 Age-related osteoporosis without current pathological fracture: Secondary | ICD-10-CM | POA: Diagnosis not present

## 2022-07-02 DIAGNOSIS — M0579 Rheumatoid arthritis with rheumatoid factor of multiple sites without organ or systems involvement: Secondary | ICD-10-CM | POA: Diagnosis not present

## 2022-07-02 DIAGNOSIS — N1831 Chronic kidney disease, stage 3a: Secondary | ICD-10-CM | POA: Diagnosis not present

## 2022-07-02 DIAGNOSIS — Z79899 Other long term (current) drug therapy: Secondary | ICD-10-CM | POA: Diagnosis not present

## 2022-07-02 DIAGNOSIS — I10 Essential (primary) hypertension: Secondary | ICD-10-CM | POA: Diagnosis not present

## 2022-07-02 DIAGNOSIS — I129 Hypertensive chronic kidney disease with stage 1 through stage 4 chronic kidney disease, or unspecified chronic kidney disease: Secondary | ICD-10-CM | POA: Diagnosis not present

## 2022-08-21 DIAGNOSIS — H40013 Open angle with borderline findings, low risk, bilateral: Secondary | ICD-10-CM | POA: Diagnosis not present

## 2022-08-21 DIAGNOSIS — H25813 Combined forms of age-related cataract, bilateral: Secondary | ICD-10-CM | POA: Diagnosis not present

## 2022-08-21 DIAGNOSIS — H35033 Hypertensive retinopathy, bilateral: Secondary | ICD-10-CM | POA: Diagnosis not present

## 2022-08-21 DIAGNOSIS — H353131 Nonexudative age-related macular degeneration, bilateral, early dry stage: Secondary | ICD-10-CM | POA: Diagnosis not present

## 2022-09-24 DIAGNOSIS — M810 Age-related osteoporosis without current pathological fracture: Secondary | ICD-10-CM | POA: Diagnosis not present

## 2022-12-04 DIAGNOSIS — I10 Essential (primary) hypertension: Secondary | ICD-10-CM | POA: Diagnosis not present

## 2022-12-04 DIAGNOSIS — M150 Primary generalized (osteo)arthritis: Secondary | ICD-10-CM | POA: Diagnosis not present

## 2022-12-04 DIAGNOSIS — Z79899 Other long term (current) drug therapy: Secondary | ICD-10-CM | POA: Diagnosis not present

## 2022-12-04 DIAGNOSIS — N289 Disorder of kidney and ureter, unspecified: Secondary | ICD-10-CM | POA: Diagnosis not present

## 2022-12-04 DIAGNOSIS — M1990 Unspecified osteoarthritis, unspecified site: Secondary | ICD-10-CM | POA: Diagnosis not present

## 2022-12-04 DIAGNOSIS — M069 Rheumatoid arthritis, unspecified: Secondary | ICD-10-CM | POA: Diagnosis not present

## 2022-12-04 DIAGNOSIS — M810 Age-related osteoporosis without current pathological fracture: Secondary | ICD-10-CM | POA: Diagnosis not present

## 2022-12-04 DIAGNOSIS — M0579 Rheumatoid arthritis with rheumatoid factor of multiple sites without organ or systems involvement: Secondary | ICD-10-CM | POA: Diagnosis not present

## 2022-12-04 DIAGNOSIS — N1831 Chronic kidney disease, stage 3a: Secondary | ICD-10-CM | POA: Diagnosis not present

## 2022-12-04 DIAGNOSIS — I129 Hypertensive chronic kidney disease with stage 1 through stage 4 chronic kidney disease, or unspecified chronic kidney disease: Secondary | ICD-10-CM | POA: Diagnosis not present

## 2022-12-24 DIAGNOSIS — I10 Essential (primary) hypertension: Secondary | ICD-10-CM | POA: Diagnosis not present

## 2022-12-24 DIAGNOSIS — I129 Hypertensive chronic kidney disease with stage 1 through stage 4 chronic kidney disease, or unspecified chronic kidney disease: Secondary | ICD-10-CM | POA: Diagnosis not present

## 2022-12-24 DIAGNOSIS — M0579 Rheumatoid arthritis with rheumatoid factor of multiple sites without organ or systems involvement: Secondary | ICD-10-CM | POA: Diagnosis not present

## 2022-12-24 DIAGNOSIS — N1831 Chronic kidney disease, stage 3a: Secondary | ICD-10-CM | POA: Diagnosis not present

## 2022-12-24 DIAGNOSIS — M810 Age-related osteoporosis without current pathological fracture: Secondary | ICD-10-CM | POA: Diagnosis not present

## 2022-12-31 DIAGNOSIS — Z0000 Encounter for general adult medical examination without abnormal findings: Secondary | ICD-10-CM | POA: Diagnosis not present

## 2022-12-31 DIAGNOSIS — M810 Age-related osteoporosis without current pathological fracture: Secondary | ICD-10-CM | POA: Diagnosis not present

## 2022-12-31 DIAGNOSIS — I10 Essential (primary) hypertension: Secondary | ICD-10-CM | POA: Diagnosis not present

## 2022-12-31 DIAGNOSIS — N1831 Chronic kidney disease, stage 3a: Secondary | ICD-10-CM | POA: Diagnosis not present

## 2022-12-31 DIAGNOSIS — I129 Hypertensive chronic kidney disease with stage 1 through stage 4 chronic kidney disease, or unspecified chronic kidney disease: Secondary | ICD-10-CM | POA: Diagnosis not present

## 2022-12-31 DIAGNOSIS — M0579 Rheumatoid arthritis with rheumatoid factor of multiple sites without organ or systems involvement: Secondary | ICD-10-CM | POA: Diagnosis not present

## 2022-12-31 DIAGNOSIS — M150 Primary generalized (osteo)arthritis: Secondary | ICD-10-CM | POA: Diagnosis not present

## 2023-03-27 DIAGNOSIS — M810 Age-related osteoporosis without current pathological fracture: Secondary | ICD-10-CM | POA: Diagnosis not present

## 2023-06-04 DIAGNOSIS — N289 Disorder of kidney and ureter, unspecified: Secondary | ICD-10-CM | POA: Diagnosis not present

## 2023-06-04 DIAGNOSIS — M069 Rheumatoid arthritis, unspecified: Secondary | ICD-10-CM | POA: Diagnosis not present

## 2023-06-04 DIAGNOSIS — M1990 Unspecified osteoarthritis, unspecified site: Secondary | ICD-10-CM | POA: Diagnosis not present

## 2023-06-04 DIAGNOSIS — I10 Essential (primary) hypertension: Secondary | ICD-10-CM | POA: Diagnosis not present

## 2023-06-04 DIAGNOSIS — N1831 Chronic kidney disease, stage 3a: Secondary | ICD-10-CM | POA: Diagnosis not present

## 2023-06-04 DIAGNOSIS — M810 Age-related osteoporosis without current pathological fracture: Secondary | ICD-10-CM | POA: Diagnosis not present

## 2023-06-04 DIAGNOSIS — Z79899 Other long term (current) drug therapy: Secondary | ICD-10-CM | POA: Diagnosis not present

## 2023-09-04 DIAGNOSIS — H35033 Hypertensive retinopathy, bilateral: Secondary | ICD-10-CM | POA: Diagnosis not present

## 2023-09-04 DIAGNOSIS — H40013 Open angle with borderline findings, low risk, bilateral: Secondary | ICD-10-CM | POA: Diagnosis not present

## 2023-09-04 DIAGNOSIS — H353131 Nonexudative age-related macular degeneration, bilateral, early dry stage: Secondary | ICD-10-CM | POA: Diagnosis not present

## 2023-09-04 DIAGNOSIS — H35363 Drusen (degenerative) of macula, bilateral: Secondary | ICD-10-CM | POA: Diagnosis not present

## 2023-09-04 DIAGNOSIS — H25813 Combined forms of age-related cataract, bilateral: Secondary | ICD-10-CM | POA: Diagnosis not present

## 2023-09-27 DIAGNOSIS — M810 Age-related osteoporosis without current pathological fracture: Secondary | ICD-10-CM | POA: Diagnosis not present

## 2023-11-25 ENCOUNTER — Other Ambulatory Visit: Payer: Self-pay | Admitting: Internal Medicine

## 2023-11-25 DIAGNOSIS — N63 Unspecified lump in unspecified breast: Secondary | ICD-10-CM | POA: Diagnosis not present

## 2023-11-25 DIAGNOSIS — N6320 Unspecified lump in the left breast, unspecified quadrant: Secondary | ICD-10-CM

## 2023-11-25 DIAGNOSIS — N6321 Unspecified lump in the left breast, upper outer quadrant: Secondary | ICD-10-CM | POA: Diagnosis not present

## 2023-11-27 ENCOUNTER — Ambulatory Visit
Admission: RE | Admit: 2023-11-27 | Discharge: 2023-11-27 | Disposition: A | Discharge status: Home / Self Care | Payer: Medicare (Managed Care) | Source: Ambulatory Visit | Attending: Internal Medicine | Admitting: Internal Medicine

## 2023-11-27 ENCOUNTER — Other Ambulatory Visit: Payer: Medicare Other

## 2023-11-27 ENCOUNTER — Ambulatory Visit
Admission: RE | Admit: 2023-11-27 | Discharge: 2023-11-27 | Disposition: A | Discharge status: Home / Self Care | Payer: Medicare Other | Source: Ambulatory Visit | Attending: Internal Medicine | Admitting: Internal Medicine

## 2023-11-27 DIAGNOSIS — N6320 Unspecified lump in the left breast, unspecified quadrant: Secondary | ICD-10-CM

## 2023-11-28 ENCOUNTER — Other Ambulatory Visit: Payer: Medicare Other

## 2023-11-29 ENCOUNTER — Other Ambulatory Visit: Payer: Self-pay | Admitting: Internal Medicine

## 2023-11-29 DIAGNOSIS — N6320 Unspecified lump in the left breast, unspecified quadrant: Secondary | ICD-10-CM

## 2023-11-29 DIAGNOSIS — R2232 Localized swelling, mass and lump, left upper limb: Secondary | ICD-10-CM

## 2023-12-05 ENCOUNTER — Ambulatory Visit
Admission: RE | Admit: 2023-12-05 | Discharge: 2023-12-05 | Disposition: A | Discharge status: Home / Self Care | Payer: Medicare (Managed Care) | Source: Ambulatory Visit | Attending: Internal Medicine | Admitting: Internal Medicine

## 2023-12-05 DIAGNOSIS — N6320 Unspecified lump in the left breast, unspecified quadrant: Secondary | ICD-10-CM

## 2023-12-05 DIAGNOSIS — R2232 Localized swelling, mass and lump, left upper limb: Secondary | ICD-10-CM

## 2023-12-05 DIAGNOSIS — C50912 Malignant neoplasm of unspecified site of left female breast: Secondary | ICD-10-CM | POA: Diagnosis not present

## 2023-12-05 DIAGNOSIS — C50212 Malignant neoplasm of upper-inner quadrant of left female breast: Secondary | ICD-10-CM | POA: Diagnosis not present

## 2023-12-05 DIAGNOSIS — R590 Localized enlarged lymph nodes: Secondary | ICD-10-CM | POA: Diagnosis not present

## 2023-12-05 DIAGNOSIS — C50412 Malignant neoplasm of upper-outer quadrant of left female breast: Secondary | ICD-10-CM | POA: Diagnosis not present

## 2023-12-05 DIAGNOSIS — Z170 Estrogen receptor positive status [ER+]: Secondary | ICD-10-CM | POA: Diagnosis not present

## 2023-12-05 DIAGNOSIS — N6321 Unspecified lump in the left breast, upper outer quadrant: Secondary | ICD-10-CM | POA: Diagnosis not present

## 2023-12-05 DIAGNOSIS — N6322 Unspecified lump in the left breast, upper inner quadrant: Secondary | ICD-10-CM | POA: Diagnosis not present

## 2023-12-05 HISTORY — PX: BREAST BIOPSY: SHX20

## 2023-12-06 ENCOUNTER — Encounter: Payer: Self-pay | Admitting: *Deleted

## 2023-12-06 ENCOUNTER — Telehealth: Payer: Self-pay | Admitting: *Deleted

## 2023-12-06 LAB — SURGICAL PATHOLOGY

## 2023-12-06 NOTE — Telephone Encounter (Signed)
 Spoke to patient to confirm upcoming afternoon [facility] clinic appointment on 3/5, paperwork will be sent via mail.  Gave location and time, also informed patient that the surgeon's office would be calling as well to get information from them similar to the packet that they will be receiving so make sure to do both.  Reminded patient that all providers will be coming to the clinic to see them HERE and if they had any questions to not hesitate to reach back out to myself or their navigators.

## 2023-12-10 ENCOUNTER — Other Ambulatory Visit: Payer: Self-pay | Admitting: *Deleted

## 2023-12-10 ENCOUNTER — Encounter: Payer: Self-pay | Admitting: *Deleted

## 2023-12-10 DIAGNOSIS — Z170 Estrogen receptor positive status [ER+]: Secondary | ICD-10-CM

## 2023-12-11 ENCOUNTER — Other Ambulatory Visit: Payer: Self-pay | Admitting: Hematology and Oncology

## 2023-12-11 ENCOUNTER — Inpatient Hospital Stay: Payer: Medicare (Managed Care) | Admitting: Genetic Counselor

## 2023-12-11 ENCOUNTER — Encounter: Payer: Self-pay | Admitting: Hematology and Oncology

## 2023-12-11 ENCOUNTER — Ambulatory Visit
Admission: RE | Admit: 2023-12-11 | Discharge: 2023-12-11 | Disposition: A | Discharge status: Home / Self Care | Payer: Medicare (Managed Care) | Source: Ambulatory Visit | Attending: Radiation Oncology | Admitting: Radiation Oncology

## 2023-12-11 ENCOUNTER — Other Ambulatory Visit: Payer: Self-pay | Admitting: General Surgery

## 2023-12-11 ENCOUNTER — Encounter: Payer: Self-pay | Admitting: Genetic Counselor

## 2023-12-11 ENCOUNTER — Inpatient Hospital Stay: Payer: Medicare (Managed Care) | Attending: Hematology and Oncology

## 2023-12-11 ENCOUNTER — Ambulatory Visit: Payer: Medicare (Managed Care) | Admitting: Physical Therapy

## 2023-12-11 ENCOUNTER — Inpatient Hospital Stay (HOSPITAL_BASED_OUTPATIENT_CLINIC_OR_DEPARTMENT_OTHER): Payer: Medicare (Managed Care) | Admitting: Hematology and Oncology

## 2023-12-11 VITALS — BP 140/90 | HR 90 | Temp 97.8°F | Resp 16 | Ht 61.0 in | Wt 127.0 lb

## 2023-12-11 DIAGNOSIS — C50812 Malignant neoplasm of overlapping sites of left female breast: Secondary | ICD-10-CM | POA: Insufficient documentation

## 2023-12-11 DIAGNOSIS — Z79899 Other long term (current) drug therapy: Secondary | ICD-10-CM | POA: Insufficient documentation

## 2023-12-11 DIAGNOSIS — Z8041 Family history of malignant neoplasm of ovary: Secondary | ICD-10-CM

## 2023-12-11 DIAGNOSIS — Z79631 Long term (current) use of antimetabolite agent: Secondary | ICD-10-CM | POA: Insufficient documentation

## 2023-12-11 DIAGNOSIS — Z170 Estrogen receptor positive status [ER+]: Secondary | ICD-10-CM

## 2023-12-11 DIAGNOSIS — Z1732 Human epidermal growth factor receptor 2 negative status: Secondary | ICD-10-CM | POA: Diagnosis not present

## 2023-12-11 DIAGNOSIS — Z1721 Progesterone receptor positive status: Secondary | ICD-10-CM

## 2023-12-11 DIAGNOSIS — M069 Rheumatoid arthritis, unspecified: Secondary | ICD-10-CM | POA: Insufficient documentation

## 2023-12-11 LAB — CBC WITH DIFFERENTIAL (CANCER CENTER ONLY)
Abs Immature Granulocytes: 0.03 10*3/uL (ref 0.00–0.07)
Basophils Absolute: 0.1 10*3/uL (ref 0.0–0.1)
Basophils Relative: 1 %
Eosinophils Absolute: 0 10*3/uL (ref 0.0–0.5)
Eosinophils Relative: 0 %
HCT: 38.6 % (ref 36.0–46.0)
Hemoglobin: 12.7 g/dL (ref 12.0–15.0)
Immature Granulocytes: 0 %
Lymphocytes Relative: 20 %
Lymphs Abs: 1.6 10*3/uL (ref 0.7–4.0)
MCH: 30.8 pg (ref 26.0–34.0)
MCHC: 32.9 g/dL (ref 30.0–36.0)
MCV: 93.7 fL (ref 80.0–100.0)
Monocytes Absolute: 0.7 10*3/uL (ref 0.1–1.0)
Monocytes Relative: 9 %
Neutro Abs: 5.4 10*3/uL (ref 1.7–7.7)
Neutrophils Relative %: 70 %
Platelet Count: 245 10*3/uL (ref 150–400)
RBC: 4.12 MIL/uL (ref 3.87–5.11)
RDW: 14.2 % (ref 11.5–15.5)
WBC Count: 7.9 10*3/uL (ref 4.0–10.5)
nRBC: 0 % (ref 0.0–0.2)

## 2023-12-11 LAB — CMP (CANCER CENTER ONLY)
ALT: 28 U/L (ref 0–44)
AST: 39 U/L (ref 15–41)
Albumin: 4.5 g/dL (ref 3.5–5.0)
Alkaline Phosphatase: 35 U/L — ABNORMAL LOW (ref 38–126)
Anion gap: 7 (ref 5–15)
BUN: 20 mg/dL (ref 8–23)
CO2: 28 mmol/L (ref 22–32)
Calcium: 10.1 mg/dL (ref 8.9–10.3)
Chloride: 105 mmol/L (ref 98–111)
Creatinine: 0.96 mg/dL (ref 0.44–1.00)
GFR, Estimated: 58 mL/min — ABNORMAL LOW (ref >60)
Glucose, Bld: 99 mg/dL (ref 70–99)
Potassium: 4.2 mmol/L (ref 3.5–5.1)
Sodium: 140 mmol/L (ref 135–145)
Total Bilirubin: 0.4 mg/dL (ref 0.0–1.2)
Total Protein: 8.1 g/dL (ref 6.5–8.1)

## 2023-12-11 LAB — GENETIC SCREENING ORDER

## 2023-12-11 MED ORDER — ANASTROZOLE 1 MG PO TABS: 1.0000 mg | ORAL_TABLET | Freq: Every day | ORAL | 3 refills | Status: DC

## 2023-12-11 NOTE — Progress Notes (Signed)
 REFERRING PROVIDER: Serena Croissant, MD [address] [place],  [place] [address]  PRIMARY PROVIDER:  Georgianne Fick, MD  PRIMARY REASON FOR VISIT:  1. Malignant neoplasm of overlapping sites of left breast in female, estrogen receptor positive (HCC)   2. Family history of ovarian cancer    HISTORY OF PRESENT ILLNESS:   Katelyn Fitzgerald, a 88 y.o. female, was seen for a [place] cancer genetics consultation at the request of Dr. Pamelia Hoit due to a personal history of breast cancer.  Katelyn Fitzgerald presents to clinic today to discuss the possibility of a hereditary predisposition to cancer, to discuss genetic testing, and to further clarify her future cancer risks, as well as potential cancer risks for family members.   In February 2025, at the age of 17, [name] was diagnosed with invasive ductal carcinoma of the left breast (two palpable masses at 10:00 and 2:00 positions), which were ER+/PR+/HER2-. The treatment plan is pending.   CANCER HISTORY:  Oncology History  Malignant neoplasm of overlapping sites of left breast in female, estrogen receptor positive (HCC)  12/05/2023 Initial Diagnosis   Patient presented with 2 palpable lumps in the left breast 10:00: 2.7 cm with skin tethering, 2:00: 4.2 cm, 3 masses in the left axilla possibly lymph nodes.  Biopsy of the first mass was IDC grade 2, ER 95%, PR 70%, Ki67 20%, HER2 negative; biopsy of second mass grade 3 IDC ER 70%, PR 60%, Ki67 90%, HER2 negative, biopsy of the lymph node grade 3 IDC   12/11/2023 Cancer Staging   Staging form: Breast, AJCC 8th Edition - Clinical stage from 12/11/2023: Stage IIB (cT2, cN1, cM0, G3, ER+, PR+, HER2-) - Signed by Serena Croissant, MD on 12/11/2023 Stage prefix: Initial diagnosis Histologic grading system: 3 grade system       Past Medical History:  Diagnosis Date   Arthritis    Breast cancer (HCC)    Hypertension    Osteoporosis     Past Surgical History:  Procedure Laterality Date    BREAST BIOPSY Left 12/05/2023   [place] LT BREAST BX W LOC DEV EA ADD LESION IMG BX SPEC US GUIDE 12/05/2023 [facility]   BREAST BIOPSY Left 12/05/2023   [place] LT BREAST BX W LOC DEV 1ST LESION IMG BX SPEC US GUIDE 12/05/2023 [facility]    FAMILY HISTORY:  We obtained a detailed, 4-generation family history.  Significant diagnoses are listed below: Family History  Problem Relation Age of Onset   Ovarian cancer Cousin        dx and d. 60s; maternal female cousin   Cancer Cousin        unknown type; maternal female cousin     Ms. Ector is unaware of previous family history of genetic testing for hereditary cancer risks. There is no reported Ashkenazi Jewish ancestry. There is no known consanguinity.  GENETIC COUNSELING ASSESSMENT: Katelyn Fitzgerald is a 88 y.o. female with a personal and family history of cancer which is somewhat suggestive of a hereditary cancer syndrome and predisposition to cancer given her personal history of breast cancer along with her cousins history of probable ovarian cancer. We, therefore, discussed and recommended the following at today's visit.   DISCUSSION: We discussed that 5 - 10% of cancer is hereditary.  Most cases of hereditary breast and ovarian cancer are associated with mutations in BRCA1/2.  There are other genes that can be associated with hereditary breast, ovarian, or other cancer syndromes.  We discussed that testing is beneficial  for several reasons including knowing how to follow individuals for their cancer risks and understanding if other family members could be at an increased risk for cancer and allowing them to undergo genetic testing.   We reviewed the characteristics, features and inheritance patterns of hereditary cancer syndromes. We also discussed genetic testing, including the appropriate family members to test, the process of testing, insurance coverage and turn-around-time for results. We discussed the implications of a negative, positive,  carrier and/or variant of uncertain significant result. We recommended Katelyn Fitzgerald pursue genetic testing for a panel that includes genes associated with breast, GYN, and other cancers.   The [facility] CancerNext+RNAinsight Panel includes sequencing, rearrangement analysis, and RNA analysis for the following 39 genes: APC, ATM, BAP1, BARD1, BMPR1A, BRCA1, BRCA2, BRIP1, CDH1, CDKN2A, CHEK2, FH, FLCN, MET, MLH1, MSH2, MSH6, MUTYH, NF1, NTHL1, PALB2, PMS2, PTEN, RAD51C, RAD51D, SMAD4, STK11, TP53, TSC1, TSC2, and VHL (sequencing and deletion/duplication); AXIN2, HOXB13, MBD4, MSH3, POLD1 and POLE (sequencing only); EPCAM and GREM1 (deletion/duplication only).  Based on Katelyn Fitzgerald's personal history of breast cancer and family history of ovarian cancer in her maternal cousin, she meets NCCN medical criteria for genetic testing.  She is the most informative relative available for genetic testing. Despite that she meets criteria, she may still have an out of pocket cost. We discussed that if her out of pocket cost for testing is over $100, the laboratory should contact her and discuss the self-pay prices and/or patient pay assistance programs.     PLAN: After considering the risks, benefits, and limitations, Ms. Jardin provided informed consent to pursue genetic testing and the blood sample was sent to [facility] for analysis of the CancerNext+RNAinsight Panel. Results should be available within approximately 3 weeks, at which point they will be disclosed by telephone to [name]'s daughter, Katelyn Fitzgerald, per Ms. Leitzke's request [phone]).  [name] will receive a summary of her genetic counseling visit and a copy of her results once available. This information will also be available in Epic.   [name] questions were answered to her satisfaction today. Our contact information was provided should additional questions or concerns arise. Thank you for the referral and allowing [place] to share in the  care of your patient.   Angeles Paolucci M. Rennie Plowman, MS, [facility] Genetic Counselor [email or web address] (P) [phone]   25 minutes were spent on the date of the encounter in service to the patient including preparation, face-to-face consultation, documentation and care coordination.  The patient was accompanied by her daughter, Katelyn Fitzgerald.  Dr. Pamelia Hoit was available to discuss this case as needed.    _______________________________________________________________________ For Office Staff:  Number of people involved in session: 2 Was an Intern/ student involved with case: no

## 2023-12-11 NOTE — Progress Notes (Addendum)
 Radiation Oncology         [phone] ________________________________  Name: Katelyn Fitzgerald        MRN: [id number]  Date of Service: 12/11/2023 DOB: 1936/08/27  YI:[id number], Katelyn Lerner, MD  Almond Lint, MD     REFERRING PHYSICIAN: Almond Lint, MD   DIAGNOSIS: The encounter diagnosis was Malignant neoplasm of overlapping sites of left breast in female, estrogen receptor positive (HCC).   Cancer Staging  Malignant neoplasm of overlapping sites of left breast in female, estrogen receptor positive (HCC) Staging form: Breast, AJCC 8th Edition - Clinical stage from 12/11/2023: Stage IIB (cT2, cN1, cM0, G3, ER+, PR+, HER2-) - Signed by Serena Croissant, MD on 12/11/2023 Stage prefix: Initial diagnosis Histologic grading system: 3 grade system  Stage IIB (cT2, cN1, M0) high grade invasive ductal carcinoma, ER/PR+, HER2-  HISTORY OF PRESENT ILLNESS: Katelyn Fitzgerald is a 88 y.o. female seen in the multidisciplinary breast clinic for a new diagnosis of left breast cancer. The patient was noted to have two palpable masses. She proceeded with diagnostic mammogram and ultrasound on 11/27/2023 that showed two masses in the left breast measuring at 2.7 cm in the 10:00 position and 4.2 cm in the 2:00 position which may be invading the overlying skin; and three masses in the left axilla, the largest measuring 2.4 cm, which could represent an additional primary tumor versus a replaced lymph node. Accordingly, patient underwent a biopsy of the right breast masses on 12/05/2023.  Pathology from the of 4.2 cm mass revealed high grade invasive ductal carcinoma that was ER/PR positive and HER2 negative with a Ki-67 of 90%. Pathology from the left axilla biopsy revealed intermediate grade invasive ductal carcinoma that was ER and PR positive and HER2 negative with a Ki-67 20%.  She is seen today to discuss treatment recommendations of her cancer.      PREVIOUS RADIATION THERAPY: No   PAST MEDICAL HISTORY:  Past  Medical History:  Diagnosis Date   Arthritis    Breast cancer (HCC)    Hypertension    Osteoporosis        PAST SURGICAL HISTORY: Past Surgical History:  Procedure Laterality Date   BREAST BIOPSY Left 12/05/2023   [place] LT BREAST BX W LOC DEV EA ADD LESION IMG BX SPEC US GUIDE 12/05/2023 [facility]   BREAST BIOPSY Left 12/05/2023   [place] LT BREAST BX W LOC DEV 1ST LESION IMG BX SPEC US GUIDE 12/05/2023 [facility]     FAMILY HISTORY: No family history on file.   SOCIAL HISTORY:  reports that she has never smoked. She has never used smokeless tobacco. She reports that she does not drink alcohol and does not use drugs.   ALLERGIES: Patient has no known allergies.   MEDICATIONS:  Current Outpatient Medications  Medication Sig Dispense Refill   acetaminophen (TYLENOL) 650 MG CR tablet Take 650 mg by mouth 2 (two) times daily.     alendronate (FOSAMAX) 70 MG tablet Take 70 mg by mouth every Sunday. Take with a full glass of water on an empty stomach.     anastrozole (ARIMIDEX) 1 MG tablet Take 1 tablet (1 mg total) by mouth daily. 90 tablet 3   Calcium Carbonate-Vit D-Min (CALCIUM 1200 PO) Take 1 tablet by mouth 2 (two) times daily.     cholecalciferol (VITAMIN D) 1000 units tablet Take 1,000 Units by mouth daily.     lisinopril (PRINIVIL,ZESTRIL) 10 MG tablet Take 10 mg by mouth daily.     methotrexate (  RHEUMATREX) 2.5 MG tablet Take 2.5 mg by mouth once a week. Caution:Chemotherapy. Protect from light.     Multiple Vitamin (MULTIVITAMIN WITH MINERALS) TABS tablet Take 1 tablet by mouth daily.     Multiple Vitamins-Minerals (PRESERVISION AREDS 2+MULTI VIT) CAPS Take 1 capsule by mouth daily.     omega-3 acid ethyl esters (LOVAZA) 1 g capsule Take 1 g by mouth daily.     triamterene-hydrochlorothiazide (MAXZIDE-25) 37.5-25 MG tablet Take 1 tablet by mouth daily.     trolamine salicylate (ASPERCREME) 10 % cream Apply 1 application topically as needed for muscle pain.     No  current facility-administered medications for this encounter.     REVIEW OF SYSTEMS: On review of systems, the patient reports that she is doing well overall. Patient notes a palpable left breast lump and bruising from her biopsy, but otherwise denies any pain, skin changes, or any other changes to her breasts.       PHYSICAL EXAM:  Wt Readings from Last 3 Encounters:  12/11/23 127 lb (57.6 kg)   Temp Readings from Last 3 Encounters:  12/11/23 97.8 F (36.6 C) (Temporal)  10/05/16 98.2 F (36.8 C) (Oral)   BP Readings from Last 3 Encounters:  12/11/23 (!) 140/90  10/05/16 121/75   Pulse Readings from Last 3 Encounters:  12/11/23 90  10/05/16 110    In general this is a well appearing female in no acute distress. She's alert and oriented x4 and appropriate throughout the examination. Cardiopulmonary assessment is negative for acute distress and she exhibits normal effort. Bilateral breast exam is deferred.    ECOG = 0  0 - Asymptomatic (Fully active, able to carry on all predisease activities without restriction)  1 - Symptomatic but completely ambulatory (Restricted in physically strenuous activity but ambulatory and able to carry out work of a light or sedentary nature. For example, light housework, office work)  2 - Symptomatic, <50% in bed during the day (Ambulatory and capable of all self care but unable to carry out any work activities. Up and about more than 50% of waking hours)  3 - Symptomatic, >50% in bed, but not bedbound (Capable of only limited self-care, confined to bed or chair 50% or more of waking hours)  4 - Bedbound (Completely disabled. Cannot carry on any self-care. Totally confined to bed or chair)  5 - Death   Santiago Glad MM, Creech RH, Tormey DC, et al. [phone]). "Toxicity and response criteria of the [facility]". Am. Evlyn Clines. Oncol. 5 (6): 649-55    LABORATORY DATA:  Lab Results  Component Value Date   WBC 7.9 12/11/2023    HGB 12.7 12/11/2023   HCT 38.6 12/11/2023   MCV 93.7 12/11/2023   PLT 245 12/11/2023   Lab Results  Component Value Date   NA 140 12/11/2023   K 4.2 12/11/2023   CL 105 12/11/2023   CO2 28 12/11/2023   Lab Results  Component Value Date   ALT 28 12/11/2023   AST 39 12/11/2023   ALKPHOS 35 (L) 12/11/2023   BILITOT 0.4 12/11/2023      RADIOGRAPHY: [place] LT BREAST BX W LOC DEV 1ST LESION IMG BX SPEC US GUIDE Addendum Date: 12/06/2023 ADDENDUM REPORT: 12/06/2023 15:07 ADDENDUM: PATHOLOGY revealed: Site 1. Breast, left, needle core biopsy, ribbon clip : INVASIVE MODERATELY DIFFERENTIATED DUCTAL CARCINOMA, GRADE 2. NEGATIVE FOR ANGIOLYMPHATIC INVASION. TUMOR MEASURES 15 MM IN GREATEST LINEAR EXTENT. Pathology results are CONCORDANT with imaging findings, per Dr. Trey Paula  Hu. PATHOLOGY revealed: Site 2. Breast, left, needle core biopsy, heart clip : INVASIVE POORLY DIFFERENTIATED DUCTAL CARCINOMA, GRADE 3. NEGATIVE FOR ANGIOLYMPHATIC INVASION. CALCIFICATIONS PRESENT WITHIN INVASIVE TUMOR. TUMOR MEASURES 12.3 MM IN GREATEST LINEAR EXTENT. Pathology results are CONCORDANT with imaging findings, per Dr. Laveda Abbe. PATHOLOGY revealed: Site 3. Breast, left, needle core biopsy, Spiral clip : INVASIVE POORLY DIFFERENTIATED DUCTAL CARCINOMA. OVERALL GRADE 3. NEGATIVE FOR ANGIOLYMPHATIC INVASION. TUMOR MEASURES 13.5 MM IN GREATEST LINEAR EXTENT. NEGATIVE FOR LYMPH NODE. Pathology results are CONCORDANT with imaging findings, per Dr. Laveda Abbe. Pathology results and recommendations below were discussed with patient and daughter Hollie Salk) by telephone on 12/06/2023 by Randa Lynn RN. Patient reported biopsy site within normal limits with slight tenderness at the site. Post biopsy care instructions were reviewed, questions were answered and my direct phone number was provided to patient. Patient was instructed to call [facility] if any concerns or questions arise related to the biopsy. RECOMMENDATIONS: 1.  Surgical and oncological consultation. Patient was referred to the [facility] at [facility] with appointment on 12/11/2023 ay 12 noon. 2. Consider pretreatment bilateral breast MRI with and without contrast to assess extent of breast disease, as clinically indicated. Pathology results reported by Randa Lynn RN on 12/06/2023. Electronically Signed   By: Harmon Pier M.D.   On: 12/06/2023 15:07   Result Date: 12/06/2023 CLINICAL DATA:  88 year old female presents for tissue sampling of 2 highly suspicious LEFT breast masses and an irregular LEFT axillary mass/involved lymph node. EXAM: ULTRASOUND GUIDED LEFT BREAST CORE NEEDLE BIOPSY X 2 [place] AXILLARY CORE BIOPSY LEFT COMPARISON:  Previous exam(s). PROCEDURE: I met with the patient and we discussed the procedures of ultrasound-guided biopsy, including benefits and alternatives. We discussed the high likelihood of a successful procedure. We discussed the risks of the procedures, including infection, bleeding, tissue injury, clip migration, and inadequate sampling. Informed written consent was given. The usual time-out protocol was performed immediately prior to the procedures. ULTRASOUND GUIDED LEFT BREAST CORE NEEDLE BIOPSY #1 (2.7 cm 10 o'clock position mass-RIBBON clip): Lesion quadrant: UPPER INNER LEFT breast Using sterile technique and 1% Lidocaine with and without epinephrine as local anesthetic, under direct ultrasound visualization, a 12 gauge spring-loaded device was used to perform biopsy of the 2.7 cm 10 o'clock position LEFT breast mass using a INFERIOR approach. At the conclusion of the procedure a RIBBON shaped tissue marker clip was deployed into the biopsy cavity. Follow up 2 view mammogram was performed and dictated separately. ULTRASOUND GUIDED LEFT BREAST CORE NEEDLE BIOPSY #2 (4.2 cm 2 o'clock position mass-HEART clip): Lesion quadrant: UPPER-OUTER LEFT breast Using sterile technique and 1%  Lidocaine with and without epinephrine as local anesthetic, under direct ultrasound visualization, a 14 gauge spring-loaded device was used to perform biopsy of the 4.2 cm 2 o'clock position mass using a MEDIAL approach. At the conclusion of the procedure a HEART shaped tissue marker clip was deployed into the biopsy cavity. Follow up 2 view mammogram was performed and dictated separately. [place] AXILLARY CORE BIOPSY LEFT Using sterile technique and 1% Lidocaine with and without epinephrine as local anesthetic, under direct ultrasound visualization, a 14 gauge spring-loaded device was used to perform biopsy of the 2.4 cm LEFT axillary mass using a INFERIOR approach. At the conclusion of the procedure a HydroMARK spiral tissue marker clip was deployed into the biopsy cavity. Follow up 2 view mammogram was performed and dictated separately. IMPRESSION: Ultrasound guided biopsy of  2.7 cm 10 o'clock position LEFT breast mass (RIBBON clip). Ultrasound-guided biopsy of 4.2 cm 2 o'clock position LEFT breast mass (HEART clip). Ultrasound-guided biopsy of 2.4 cm LEFT axillary mass (HydroMARK spiral clip). No apparent complications. Electronically Signed: By: Harmon Pier M.D. On: 12/05/2023 15:11   [place] LT BREAST BX W LOC DEV EA ADD LESION IMG BX SPEC US GUIDE Addendum Date: 12/06/2023 ADDENDUM REPORT: 12/06/2023 15:07 ADDENDUM: PATHOLOGY revealed: Site 1. Breast, left, needle core biopsy, ribbon clip : INVASIVE MODERATELY DIFFERENTIATED DUCTAL CARCINOMA, GRADE 2. NEGATIVE FOR ANGIOLYMPHATIC INVASION. TUMOR MEASURES 15 MM IN GREATEST LINEAR EXTENT. Pathology results are CONCORDANT with imaging findings, per Dr. Laveda Abbe. PATHOLOGY revealed: Site 2. Breast, left, needle core biopsy, heart clip : INVASIVE POORLY DIFFERENTIATED DUCTAL CARCINOMA, GRADE 3. NEGATIVE FOR ANGIOLYMPHATIC INVASION. CALCIFICATIONS PRESENT WITHIN INVASIVE TUMOR. TUMOR MEASURES 12.3 MM IN GREATEST LINEAR EXTENT. Pathology results are CONCORDANT with imaging  findings, per Dr. Laveda Abbe. PATHOLOGY revealed: Site 3. Breast, left, needle core biopsy, Spiral clip : INVASIVE POORLY DIFFERENTIATED DUCTAL CARCINOMA. OVERALL GRADE 3. NEGATIVE FOR ANGIOLYMPHATIC INVASION. TUMOR MEASURES 13.5 MM IN GREATEST LINEAR EXTENT. NEGATIVE FOR LYMPH NODE. Pathology results are CONCORDANT with imaging findings, per Dr. Laveda Abbe. Pathology results and recommendations below were discussed with patient and daughter Hollie Salk) by telephone on 12/06/2023 by Randa Lynn RN. Patient reported biopsy site within normal limits with slight tenderness at the site. Post biopsy care instructions were reviewed, questions were answered and my direct phone number was provided to patient. Patient was instructed to call [facility] if any concerns or questions arise related to the biopsy. RECOMMENDATIONS: 1. Surgical and oncological consultation. Patient was referred to the [facility] at [facility] with appointment on 12/11/2023 ay 12 noon. 2. Consider pretreatment bilateral breast MRI with and without contrast to assess extent of breast disease, as clinically indicated. Pathology results reported by Randa Lynn RN on 12/06/2023. Electronically Signed   By: Harmon Pier M.D.   On: 12/06/2023 15:07   Result Date: 12/06/2023 CLINICAL DATA:  88 year old female presents for tissue sampling of 2 highly suspicious LEFT breast masses and an irregular LEFT axillary mass/involved lymph node. EXAM: ULTRASOUND GUIDED LEFT BREAST CORE NEEDLE BIOPSY X 2 [place] AXILLARY CORE BIOPSY LEFT COMPARISON:  Previous exam(s). PROCEDURE: I met with the patient and we discussed the procedures of ultrasound-guided biopsy, including benefits and alternatives. We discussed the high likelihood of a successful procedure. We discussed the risks of the procedures, including infection, bleeding, tissue injury, clip migration, and inadequate sampling. Informed written consent  was given. The usual time-out protocol was performed immediately prior to the procedures. ULTRASOUND GUIDED LEFT BREAST CORE NEEDLE BIOPSY #1 (2.7 cm 10 o'clock position mass-RIBBON clip): Lesion quadrant: UPPER INNER LEFT breast Using sterile technique and 1% Lidocaine with and without epinephrine as local anesthetic, under direct ultrasound visualization, a 12 gauge spring-loaded device was used to perform biopsy of the 2.7 cm 10 o'clock position LEFT breast mass using a INFERIOR approach. At the conclusion of the procedure a RIBBON shaped tissue marker clip was deployed into the biopsy cavity. Follow up 2 view mammogram was performed and dictated separately. ULTRASOUND GUIDED LEFT BREAST CORE NEEDLE BIOPSY #2 (4.2 cm 2 o'clock position mass-HEART clip): Lesion quadrant: UPPER-OUTER LEFT breast Using sterile technique and 1% Lidocaine with and without epinephrine as local anesthetic, under direct ultrasound visualization, a 14 gauge spring-loaded device was used to perform biopsy of the 4.2  cm 2 o'clock position mass using a MEDIAL approach. At the conclusion of the procedure a HEART shaped tissue marker clip was deployed into the biopsy cavity. Follow up 2 view mammogram was performed and dictated separately. [place] AXILLARY CORE BIOPSY LEFT Using sterile technique and 1% Lidocaine with and without epinephrine as local anesthetic, under direct ultrasound visualization, a 14 gauge spring-loaded device was used to perform biopsy of the 2.4 cm LEFT axillary mass using a INFERIOR approach. At the conclusion of the procedure a HydroMARK spiral tissue marker clip was deployed into the biopsy cavity. Follow up 2 view mammogram was performed and dictated separately. IMPRESSION: Ultrasound guided biopsy of 2.7 cm 10 o'clock position LEFT breast mass (RIBBON clip). Ultrasound-guided biopsy of 4.2 cm 2 o'clock position LEFT breast mass (HEART clip). Ultrasound-guided biopsy of 2.4 cm LEFT axillary mass (HydroMARK spiral clip).  No apparent complications. Electronically Signed: By: Harmon Pier M.D. On: 12/05/2023 15:11   [place] AXILLARY NODE CORE BIOPSY LEFT Addendum Date: 12/06/2023 ADDENDUM REPORT: 12/06/2023 15:07 ADDENDUM: PATHOLOGY revealed: Site 1. Breast, left, needle core biopsy, ribbon clip : INVASIVE MODERATELY DIFFERENTIATED DUCTAL CARCINOMA, GRADE 2. NEGATIVE FOR ANGIOLYMPHATIC INVASION. TUMOR MEASURES 15 MM IN GREATEST LINEAR EXTENT. Pathology results are CONCORDANT with imaging findings, per Dr. Laveda Abbe. PATHOLOGY revealed: Site 2. Breast, left, needle core biopsy, heart clip : INVASIVE POORLY DIFFERENTIATED DUCTAL CARCINOMA, GRADE 3. NEGATIVE FOR ANGIOLYMPHATIC INVASION. CALCIFICATIONS PRESENT WITHIN INVASIVE TUMOR. TUMOR MEASURES 12.3 MM IN GREATEST LINEAR EXTENT. Pathology results are CONCORDANT with imaging findings, per Dr. Laveda Abbe. PATHOLOGY revealed: Site 3. Breast, left, needle core biopsy, Spiral clip : INVASIVE POORLY DIFFERENTIATED DUCTAL CARCINOMA. OVERALL GRADE 3. NEGATIVE FOR ANGIOLYMPHATIC INVASION. TUMOR MEASURES 13.5 MM IN GREATEST LINEAR EXTENT. NEGATIVE FOR LYMPH NODE. Pathology results are CONCORDANT with imaging findings, per Dr. Laveda Abbe. Pathology results and recommendations below were discussed with patient and daughter Hollie Salk) by telephone on 12/06/2023 by Randa Lynn RN. Patient reported biopsy site within normal limits with slight tenderness at the site. Post biopsy care instructions were reviewed, questions were answered and my direct phone number was provided to patient. Patient was instructed to call [facility] if any concerns or questions arise related to the biopsy. RECOMMENDATIONS: 1. Surgical and oncological consultation. Patient was referred to the [facility] at [facility] with appointment on 12/11/2023 ay 12 noon. 2. Consider pretreatment bilateral breast MRI with and without contrast to assess extent of breast  disease, as clinically indicated. Pathology results reported by Randa Lynn RN on 12/06/2023. Electronically Signed   By: Harmon Pier M.D.   On: 12/06/2023 15:07   Result Date: 12/06/2023 CLINICAL DATA:  88 year old female presents for tissue sampling of 2 highly suspicious LEFT breast masses and an irregular LEFT axillary mass/involved lymph node. EXAM: ULTRASOUND GUIDED LEFT BREAST CORE NEEDLE BIOPSY X 2 [place] AXILLARY CORE BIOPSY LEFT COMPARISON:  Previous exam(s). PROCEDURE: I met with the patient and we discussed the procedures of ultrasound-guided biopsy, including benefits and alternatives. We discussed the high likelihood of a successful procedure. We discussed the risks of the procedures, including infection, bleeding, tissue injury, clip migration, and inadequate sampling. Informed written consent was given. The usual time-out protocol was performed immediately prior to the procedures. ULTRASOUND GUIDED LEFT BREAST CORE NEEDLE BIOPSY #1 (2.7 cm 10 o'clock position mass-RIBBON clip): Lesion quadrant: UPPER INNER LEFT breast Using sterile technique and 1% Lidocaine with and without epinephrine as local anesthetic, under direct  ultrasound visualization, a 12 gauge spring-loaded device was used to perform biopsy of the 2.7 cm 10 o'clock position LEFT breast mass using a INFERIOR approach. At the conclusion of the procedure a RIBBON shaped tissue marker clip was deployed into the biopsy cavity. Follow up 2 view mammogram was performed and dictated separately. ULTRASOUND GUIDED LEFT BREAST CORE NEEDLE BIOPSY #2 (4.2 cm 2 o'clock position mass-HEART clip): Lesion quadrant: UPPER-OUTER LEFT breast Using sterile technique and 1% Lidocaine with and without epinephrine as local anesthetic, under direct ultrasound visualization, a 14 gauge spring-loaded device was used to perform biopsy of the 4.2 cm 2 o'clock position mass using a MEDIAL approach. At the conclusion of the procedure a HEART shaped tissue marker clip was  deployed into the biopsy cavity. Follow up 2 view mammogram was performed and dictated separately. [place] AXILLARY CORE BIOPSY LEFT Using sterile technique and 1% Lidocaine with and without epinephrine as local anesthetic, under direct ultrasound visualization, a 14 gauge spring-loaded device was used to perform biopsy of the 2.4 cm LEFT axillary mass using a INFERIOR approach. At the conclusion of the procedure a HydroMARK spiral tissue marker clip was deployed into the biopsy cavity. Follow up 2 view mammogram was performed and dictated separately. IMPRESSION: Ultrasound guided biopsy of 2.7 cm 10 o'clock position LEFT breast mass (RIBBON clip). Ultrasound-guided biopsy of 4.2 cm 2 o'clock position LEFT breast mass (HEART clip). Ultrasound-guided biopsy of 2.4 cm LEFT axillary mass (HydroMARK spiral clip). No apparent complications. Electronically Signed: By: Harmon Pier M.D. On: 12/05/2023 15:11   MM CLIP PLACEMENT LEFT Result Date: 12/05/2023 CLINICAL DATA:  Evaluate placement of biopsy clips following 2 ultrasound-guided LEFT breast biopsies in 1 ultrasound-guided LEFT axillary biopsy. EXAM: 3D DIAGNOSTIC LEFT MAMMOGRAM POST ULTRASOUND BIOPSY COMPARISON:  Previous exam(s). ACR Breast Density Category c: The breasts are heterogeneously dense, which may obscure small masses. FINDINGS: Multiple 3D Mammographic images were obtained following ultrasound guided biopsy of the 2.7 cm UPPER INNER LEFT breast mass (RIBBON clip), the 4.2 cm UPPER-OUTER LEFT breast mass (HEART clip), and 2.4 cm LEFT axillary mass (spiral clip). The RIBBON biopsy marking clip is not visualized despite numerous attempts. The HEART biopsy marking clip is in satisfactory position. The spiral biopsy marking clip is in satisfactory position IMPRESSION: Appropriate positioning of the HEART shaped biopsy marking clip at the site of biopsy in the UPPER OUTER LEFT breast. Appropriate positioning of the HydroMARK spiral biopsy marking clip in the LEFT  axilla. Due to position, the RIBBON biopsy clip could not be visualized on this study (2.7 cm UPPER INNER LEFT breast mass). Final Assessment: Post Procedure Mammograms for Marker Placement Electronically Signed   By: Harmon Pier M.D.   On: 12/05/2023 15:29   MM 3D DIAGNOSTIC MAMMOGRAM BILATERAL BREAST Result Date: 11/27/2023 CLINICAL DATA:  88 year old female presenting for baseline mammogram to evaluate 2 palpable lumps in the left breast. EXAM: DIGITAL DIAGNOSTIC BILATERAL MAMMOGRAM WITH TOMOSYNTHESIS AND CAD; ULTRASOUND LEFT BREAST LIMITED TECHNIQUE: Bilateral digital diagnostic mammography and breast tomosynthesis was performed. The images were evaluated with computer-aided detection. ; Targeted ultrasound examination of the left breast was performed. COMPARISON:  None available. ACR Breast Density Category b: There are scattered areas of fibroglandular density. FINDINGS: In the upper outer posterior left breast, there is a incompletely visualized irregular mass with associated pleomorphic calcifications spanning at least 4.7 cm. There is a second mass in the superior posterior left breast that is irregular with spiculated margins and associated calcifications measuring approximately 2.5 cm.  Skin tethering is noted at this site. There is mild diffuse skin thickening of the left breast. No suspicious calcifications, masses or areas of distortion are seen in the right breast. Ultrasound of the palpable site in the left breast at 10 o'clock, 11 cm from the nipple demonstrates an irregular hypoechoic mass measuring 2.7 x 1.6 x 2.4 cm. The mass abuts the posterior skin surface, and may be invading the skin. The palpable site in the left breast at 2 o'clock, 7 cm from the nipple demonstrates a heterogeneous irregular mass measuring 4.1 x 2.9 x 4.2 cm. This abuts and tethers the overlying skin, and may be invading the skin. In the left axilla, there is an irregular hypoechoic mass measuring 2.4 x 2.0 x 2.2 cm. This  could be a third primary tumor versus a replaced lymph node. There are 2 additional masses/possible lymph nodes in the left axilla which are smaller in size, one measuring 1.4 cm, and another 0.6 cm. IMPRESSION: 1. There are 2 masses in the left breast at 10 o'clock and at 2 o'clock which are highly suggestive of breast cancer. Both of these masses may be invading the overlying skin. 2. There are 3 masses in the left axilla, the largest of which measures 2.4 cm. This largest mass could represent an additional primary tumor versus a replaced lymph node. The 2 smaller left axillary masses are favored to represent metastatic lymph nodes. 3.  No evidence of right breast malignancy. RECOMMENDATION: Ultrasound guided biopsy is recommended for the left breast masses at 10 o'clock and 2 o'clock, as well as the largest left axillary mass. I have discussed the findings and recommendations with the patient. If applicable, a reminder letter will be sent to the patient regarding the next appointment. BI-RADS CATEGORY  5: Highly suggestive of malignancy. Electronically Signed   By: Frederico Hamman M.D.   On: 11/27/2023 08:27   [place] LIMITED ULTRASOUND INCLUDING AXILLA LEFT BREAST  Result Date: 11/27/2023 CLINICAL DATA:  88 year old female presenting for baseline mammogram to evaluate 2 palpable lumps in the left breast. EXAM: DIGITAL DIAGNOSTIC BILATERAL MAMMOGRAM WITH TOMOSYNTHESIS AND CAD; ULTRASOUND LEFT BREAST LIMITED TECHNIQUE: Bilateral digital diagnostic mammography and breast tomosynthesis was performed. The images were evaluated with computer-aided detection. ; Targeted ultrasound examination of the left breast was performed. COMPARISON:  None available. ACR Breast Density Category b: There are scattered areas of fibroglandular density. FINDINGS: In the upper outer posterior left breast, there is a incompletely visualized irregular mass with associated pleomorphic calcifications spanning at least 4.7 cm. There is a  second mass in the superior posterior left breast that is irregular with spiculated margins and associated calcifications measuring approximately 2.5 cm. Skin tethering is noted at this site. There is mild diffuse skin thickening of the left breast. No suspicious calcifications, masses or areas of distortion are seen in the right breast. Ultrasound of the palpable site in the left breast at 10 o'clock, 11 cm from the nipple demonstrates an irregular hypoechoic mass measuring 2.7 x 1.6 x 2.4 cm. The mass abuts the posterior skin surface, and may be invading the skin. The palpable site in the left breast at 2 o'clock, 7 cm from the nipple demonstrates a heterogeneous irregular mass measuring 4.1 x 2.9 x 4.2 cm. This abuts and tethers the overlying skin, and may be invading the skin. In the left axilla, there is an irregular hypoechoic mass measuring 2.4 x 2.0 x 2.2 cm. This could be a third primary tumor versus a replaced  lymph node. There are 2 additional masses/possible lymph nodes in the left axilla which are smaller in size, one measuring 1.4 cm, and another 0.6 cm. IMPRESSION: 1. There are 2 masses in the left breast at 10 o'clock and at 2 o'clock which are highly suggestive of breast cancer. Both of these masses may be invading the overlying skin. 2. There are 3 masses in the left axilla, the largest of which measures 2.4 cm. This largest mass could represent an additional primary tumor versus a replaced lymph node. The 2 smaller left axillary masses are favored to represent metastatic lymph nodes. 3.  No evidence of right breast malignancy. RECOMMENDATION: Ultrasound guided biopsy is recommended for the left breast masses at 10 o'clock and 2 o'clock, as well as the largest left axillary mass. I have discussed the findings and recommendations with the patient. If applicable, a reminder letter will be sent to the patient regarding the next appointment. BI-RADS CATEGORY  5: Highly suggestive of malignancy.  Electronically Signed   By: Frederico Hamman M.D.   On: 11/27/2023 08:27       IMPRESSION/PLAN: 1. Stage IIB (cT2, cN1, M0) high grade invasive ductal carcinoma, ER/PR+, HER2- Dr. Mitzi Hansen discussed the pathology findings and reviewed the nature of stage II breast cancer. The consensus from the breast conference includes neoadjuvant hormone therapy for 3-6 months, left mastectomy and LND, followed by radiotherapy. Dr. Mitzi Hansen recommends external beam radiotherapy to the breast following her mastectomy to reduce the risks of local recurrence. We discussed the risks, benefits, short, and long term effects of radiotherapy, as well as the curative intent, and the patient is interested in proceeding. Dr. Mitzi Hansen discussed the delivery and logistics of radiotherapy and anticipates a course of 6.5 weeks of radiotherapy to the left breast with deep inspiration breath hold technique. We will see her back a few weeks after surgery to discuss the simulation process and anticipate starting radiotherapy about 4-6 weeks after surgery.   2. Possible genetic predisposition to malignancy.  The patient is a candidate for genetic testing given her personal and family history. She will meet with our geneticist today in clinic. 3. Currently taking Methotrexate for Rheumatoid Arthritis.  Patient is taking methotrexate for her rheumatoid arthritis. She has been doing this for the past 8 years and has never taken a drug holiday. We discussed the risks of methotrexate with radiation treatment. We will be in contact with her rheumatologist to discuss alternative options during her treatment.    In a visit lasting 60 minutes, greater than 50% of the time was spent face to face reviewing her case, as well as in preparation of, discussing, and coordinating the patient's care.  The above documentation reflects my direct findings during this shared patient visit. Please see the separate note by Dr. Mitzi Hansen on this date for the remainder of  the patient's plan of care.    Joyice Faster, [place]    **Disclaimer: This note was dictated with voice recognition software. Similar sounding words can inadvertently be transcribed and this note may contain transcription errors which may not have been corrected upon publication of note.**

## 2023-12-11 NOTE — Addendum Note (Signed)
 Encounter addended by: Erven Colla, PA-C on: 12/11/2023 3:49 PM  Actions taken: Clinical Note Signed

## 2023-12-11 NOTE — Progress Notes (Signed)
 [facility] CONSULT NOTE  Patient Care Team: Georgianne Fick, MD as PCP - General (Internal Medicine) Donnelly Angelica, RN as Oncology Nurse Navigator Pershing Proud, RN as Oncology Nurse Navigator Serena Croissant, MD as Consulting Physician (Hematology and Oncology) Dorothy Puffer, MD as Consulting Physician (Radiation Oncology) Almond Lint, MD as Consulting Physician (General Surgery)  CHIEF COMPLAINTS/PURPOSE OF CONSULTATION:  Newly diagnosed breast cancer  HISTORY OF PRESENTING ILLNESS: Ms. Katelyn Fitzgerald is a 88 year old who presented with a palpable lumps in the left breast for about a year.  She felt 2 lumps in the left breast.  Mammogram and ultrasound showed a 2.7 cm mass at 10 o'clock position with skin tethering, additionally at 2 o'clock position there was a 4.2 cm mass.  In the axilla there were 3 masses identified which were felt to be lymph nodes.  Biopsy of the 10:00 mass was grade 2 IDC that is ER/PR positive HER2 negative with a Ki67 20%.  Biopsy of the 2:00 mass came back as ER 70% PR 60% Ki67 90%, HER2 negative.  Biopsy of the axillary masses were also grade 3 IDC. She was presented this morning at the multidisciplinary tumor board and she is here today to discuss her treatment plan.  I reviewed her records extensively and collaborated the history with the patient.  SUMMARY OF ONCOLOGIC HISTORY: Oncology History  Malignant neoplasm of overlapping sites of left breast in female, estrogen receptor positive (HCC)  12/05/2023 Initial Diagnosis   Patient presented with 2 palpable lumps in the left breast 10:00: 2.7 cm with skin tethering, 2:00: 4.2 cm, 3 masses in the left axilla possibly lymph nodes.  Biopsy of the first mass was IDC grade 2, ER 95%, PR 70%, Ki67 20%, HER2 negative; biopsy of second mass grade 3 IDC ER 70%, PR 60%, Ki67 90%, HER2 negative, biopsy of the lymph node grade 3 IDC      MEDICAL HISTORY:  Past Medical History:  Diagnosis Date    Hypertension     SURGICAL HISTORY: Past Surgical History:  Procedure Laterality Date   BREAST BIOPSY Left 12/05/2023   [place] LT BREAST BX W LOC DEV EA ADD LESION IMG BX SPEC US GUIDE 12/05/2023 [facility]   BREAST BIOPSY Left 12/05/2023   [place] LT BREAST BX W LOC DEV 1ST LESION IMG BX SPEC US GUIDE 12/05/2023 [facility]    SOCIAL HISTORY: Social History   Socioeconomic History   Marital status: Married    Spouse name: Not on file   Number of children: Not on file   Years of education: Not on file   Highest education level: Not on file  Occupational History   Not on file  Tobacco Use   Smoking status: Never   Smokeless tobacco: Never  Substance and Sexual Activity   Alcohol use: No   Drug use: Not on file   Sexual activity: Not on file  Other Topics Concern   Not on file  Social History Narrative   Not on file   Social Drivers of Health   Financial Resource Strain: Not on file  Food Insecurity: No Food Insecurity (12/11/2023)   Hunger Vital Sign    Worried About Running Out of Food in the Last Year: Never true    Ran Out of Food in the Last Year: Never true  Transportation Needs: No Transportation Needs (12/11/2023)   PRAPARE - Administrator, Civil Service (Medical): No    Lack of Transportation (Non-Medical): No  Physical Activity: Not on file  Stress: Not on file  Social Connections: Unknown (12/11/2023)   Social Connection and Isolation Panel [NHANES]    Frequency of Communication with Friends and Family: Never    Frequency of Social Gatherings with Friends and Family: Never    Attends Religious Services: Never    Database administrator or Organizations: No    Attends Banker Meetings: Never    Marital Status: Not on file  Intimate Partner Violence: Not At Risk (12/11/2023)   Humiliation, Afraid, Rape, and Kick questionnaire    Fear of Current or Ex-Partner: No    Emotionally Abused: No    Physically Abused: No    Sexually Abused:  No    FAMILY HISTORY: No family history on file.  ALLERGIES:  has no known allergies.  MEDICATIONS:  Current Outpatient Medications  Medication Sig Dispense Refill   acetaminophen (TYLENOL) 650 MG CR tablet Take 650 mg by mouth 2 (two) times daily.     alendronate (FOSAMAX) 70 MG tablet Take 70 mg by mouth every Sunday. Take with a full glass of water on an empty stomach.     Calcium Carbonate-Vit D-Min (CALCIUM 1200 PO) Take 1 tablet by mouth 2 (two) times daily.     cholecalciferol (VITAMIN D) 1000 units tablet Take 1,000 Units by mouth daily.     lisinopril (PRINIVIL,ZESTRIL) 10 MG tablet Take 10 mg by mouth daily.     methotrexate (RHEUMATREX) 2.5 MG tablet Take 2.5 mg by mouth once a week. Caution:Chemotherapy. Protect from light.     Multiple Vitamin (MULTIVITAMIN WITH MINERALS) TABS tablet Take 1 tablet by mouth daily.     Multiple Vitamins-Minerals (PRESERVISION AREDS 2+MULTI VIT) CAPS Take 1 capsule by mouth daily.     omega-3 acid ethyl esters (LOVAZA) 1 g capsule Take 1 g by mouth daily.     triamterene-hydrochlorothiazide (MAXZIDE-25) 37.5-25 MG tablet Take 1 tablet by mouth daily.     trolamine salicylate (ASPERCREME) 10 % cream Apply 1 application topically as needed for muscle pain.     No current facility-administered medications for this visit.    REVIEW OF SYSTEMS:   Constitutional: Denies fevers, chills or abnormal night sweats Breast:  Denies any palpable lumps or discharge All other systems were reviewed with the patient and are negative.  PHYSICAL EXAMINATION: ECOG PERFORMANCE STATUS: 2 - Symptomatic, <50% confined to bed  Vitals:   12/11/23 1200  BP: (!) 140/90  Pulse: 90  Resp: 16  Temp: 97.8 F (36.6 C)  SpO2: 99%   Filed Weights   12/11/23 1200  Weight: 127 lb (57.6 kg)    GENERAL:alert, no distress and comfortable Breast: Palpable lumps in the left breast and axilla  LABORATORY DATA:  I have reviewed the data as listed Lab Results   Component Value Date   WBC 7.9 12/11/2023   HGB 12.7 12/11/2023   HCT 38.6 12/11/2023   MCV 93.7 12/11/2023   PLT 245 12/11/2023   Lab Results  Component Value Date   NA 140 12/11/2023   K 4.2 12/11/2023   CL 105 12/11/2023   CO2 28 12/11/2023    RADIOGRAPHIC STUDIES: I have personally reviewed the radiological reports and agreed with the findings in the report.  ASSESSMENT AND PLAN:  Malignant neoplasm of overlapping sites of left breast in female, estrogen receptor positive (HCC) 12/05/2023:Patient presented with 2 palpable lumps in the left breast 10:00: 2.7 cm with skin tethering, 2:00: 4.2 cm, 3 masses in  the left axilla possibly lymph nodes.  Biopsy of the first mass was IDC grade 2, ER 95%, PR 70%, Ki67 20%, HER2 negative; biopsy of second mass grade 3 IDC ER 70%, PR 60%, Ki67 90%, HER2 negative, biopsy of the lymph node grade 3 IDC  Pathology and radiology counseling: Discussed with the patient, the details of pathology including the type of breast cancer,the clinical staging, the significance of ER, PR and HER-2/neu receptors and the implications for treatment. After reviewing the pathology in detail, we proceeded to discuss the different treatment options between surgery, radiation, chemotherapy, antiestrogen therapies.  Treatment plan: Neoadjuvant antiestrogen therapy with anastrozole Mastectomy with targeted node surgery Adjuvant radiation therapy Adjuvant antiestrogen therapy CT CAP and bone scan MRI being done to evaluate the extent of disease as well as a contralateral breast evaluation.  After 3 months we will do a mammogram and [place] to evaluate H/O osteoporosis on Prolia inj She's lives independently but her daughter is close by to assist her. F/U in 1 month to assess tolerance to anastrozole      All questions were answered. The patient knows to call the clinic with any problems, questions or concerns.    Tamsen Meek, MD 12/11/23

## 2023-12-11 NOTE — Assessment & Plan Note (Signed)
 12/05/2023:Patient presented with 2 palpable lumps in the left breast 10:00: 2.7 cm with skin tethering, 2:00: 4.2 cm, 3 masses in the left axilla possibly lymph nodes.  Biopsy of the first mass was IDC grade 2, ER 95%, PR 70%, Ki67 20%, HER2 negative; biopsy of second mass grade 3 IDC ER 70%, PR 60%, Ki67 90%, HER2 negative, biopsy of the lymph node grade 3 IDC  Pathology and radiology counseling: Discussed with the patient, the details of pathology including the type of breast cancer,the clinical staging, the significance of ER, PR and HER-2/neu receptors and the implications for treatment. After reviewing the pathology in detail, we proceeded to discuss the different treatment options between surgery, radiation, chemotherapy, antiestrogen therapies.  Treatment plan: Neoadjuvant antiestrogen therapy with anastrozole Mastectomy with targeted node surgery Adjuvant radiation therapy Adjuvant antiestrogen therapy CT CAP and bone scan MRI being done to evaluate the extent of disease as well as a contralateral breast evaluation.  Return to clinic after surgery to discuss pathology report and then finalize a treatment plan.

## 2023-12-12 ENCOUNTER — Encounter: Payer: Self-pay | Admitting: Hematology and Oncology

## 2023-12-13 ENCOUNTER — Telehealth: Payer: Self-pay | Admitting: Hematology and Oncology

## 2023-12-13 NOTE — Telephone Encounter (Signed)
Scheduled appointment per 3/5 los. Patient is aware of the made appointment.

## 2023-12-19 ENCOUNTER — Encounter: Payer: Self-pay | Admitting: *Deleted

## 2023-12-19 ENCOUNTER — Telehealth: Payer: Self-pay | Admitting: *Deleted

## 2023-12-19 NOTE — Telephone Encounter (Signed)
 Left vm regarding [facility] from 12/11/23. Contact information provided for questions or needs.

## 2023-12-20 ENCOUNTER — Ambulatory Visit
Admission: RE | Admit: 2023-12-20 | Discharge: 2023-12-20 | Disposition: A | Discharge status: Home / Self Care | Payer: Medicare (Managed Care) | Source: Ambulatory Visit | Attending: Hematology and Oncology | Admitting: Hematology and Oncology

## 2023-12-20 ENCOUNTER — Telehealth: Payer: Self-pay | Admitting: *Deleted

## 2023-12-20 DIAGNOSIS — C50812 Malignant neoplasm of overlapping sites of left female breast: Secondary | ICD-10-CM

## 2023-12-20 DIAGNOSIS — C50412 Malignant neoplasm of upper-outer quadrant of left female breast: Secondary | ICD-10-CM | POA: Diagnosis not present

## 2023-12-20 DIAGNOSIS — C50212 Malignant neoplasm of upper-inner quadrant of left female breast: Secondary | ICD-10-CM | POA: Diagnosis not present

## 2023-12-20 DIAGNOSIS — C779 Secondary and unspecified malignant neoplasm of lymph node, unspecified: Secondary | ICD-10-CM | POA: Diagnosis not present

## 2023-12-20 MED ORDER — GADOPICLENOL 0.5 MMOL/ML IV SOLN
5.0000 mL | Freq: Once | INTRAVENOUS | Status: AC | PRN
  Administered 2023-12-20: 5 mL via INTRAVENOUS

## 2023-12-20 NOTE — Telephone Encounter (Signed)
 Spoke to pt and daughter. Denies questions or concerns regarding dx or treatment care plan. Confirmed future appts. Daughter will send [facility] paperwork to be filled out. Encourage to call with needs. Received verbal understanding.

## 2023-12-23 ENCOUNTER — Encounter: Payer: Self-pay | Admitting: *Deleted

## 2023-12-25 ENCOUNTER — Ambulatory Visit (HOSPITAL_COMMUNITY)
Admission: RE | Admit: 2023-12-25 | Discharge: 2023-12-25 | Disposition: A | Discharge status: Home / Self Care | Payer: Medicare (Managed Care) | Source: Ambulatory Visit | Attending: Hematology and Oncology | Admitting: Hematology and Oncology

## 2023-12-25 ENCOUNTER — Encounter (HOSPITAL_COMMUNITY)
Admission: RE | Admit: 2023-12-25 | Discharge: 2023-12-25 | Disposition: A | Discharge status: Home / Self Care | Payer: Medicare (Managed Care) | Source: Ambulatory Visit | Attending: Hematology and Oncology | Admitting: Hematology and Oncology

## 2023-12-25 ENCOUNTER — Other Ambulatory Visit: Payer: Self-pay | Admitting: Hematology and Oncology

## 2023-12-25 DIAGNOSIS — Z170 Estrogen receptor positive status [ER+]: Secondary | ICD-10-CM

## 2023-12-25 DIAGNOSIS — C50812 Malignant neoplasm of overlapping sites of left female breast: Secondary | ICD-10-CM | POA: Insufficient documentation

## 2023-12-25 MED ORDER — IOHEXOL 300 MG/ML  SOLN
100.0000 mL | Freq: Once | INTRAMUSCULAR | Status: AC | PRN
  Administered 2023-12-25: 100 mL via INTRAVENOUS

## 2023-12-25 MED ORDER — TECHNETIUM TC 99M MEDRONATE IV KIT
20.0000 | PACK | Freq: Once | INTRAVENOUS | Status: AC | PRN
  Administered 2023-12-25: 20.7 via INTRAVENOUS

## 2023-12-26 ENCOUNTER — Encounter: Payer: Self-pay | Admitting: *Deleted

## 2024-01-06 DIAGNOSIS — M150 Primary generalized (osteo)arthritis: Secondary | ICD-10-CM | POA: Diagnosis not present

## 2024-01-06 DIAGNOSIS — I129 Hypertensive chronic kidney disease with stage 1 through stage 4 chronic kidney disease, or unspecified chronic kidney disease: Secondary | ICD-10-CM | POA: Diagnosis not present

## 2024-01-06 DIAGNOSIS — M810 Age-related osteoporosis without current pathological fracture: Secondary | ICD-10-CM | POA: Diagnosis not present

## 2024-01-06 DIAGNOSIS — M0579 Rheumatoid arthritis with rheumatoid factor of multiple sites without organ or systems involvement: Secondary | ICD-10-CM | POA: Diagnosis not present

## 2024-01-06 DIAGNOSIS — Z0000 Encounter for general adult medical examination without abnormal findings: Secondary | ICD-10-CM | POA: Diagnosis not present

## 2024-01-06 DIAGNOSIS — N1831 Chronic kidney disease, stage 3a: Secondary | ICD-10-CM | POA: Diagnosis not present

## 2024-01-06 DIAGNOSIS — I10 Essential (primary) hypertension: Secondary | ICD-10-CM | POA: Diagnosis not present

## 2024-01-08 ENCOUNTER — Inpatient Hospital Stay: Payer: Medicare (Managed Care) | Attending: Hematology and Oncology | Admitting: Hematology and Oncology

## 2024-01-08 VITALS — BP 173/82 | HR 97 | Temp 98.7°F | Resp 18 | Ht 61.0 in | Wt 125.7 lb

## 2024-01-08 DIAGNOSIS — Z1732 Human epidermal growth factor receptor 2 negative status: Secondary | ICD-10-CM | POA: Insufficient documentation

## 2024-01-08 DIAGNOSIS — Z79899 Other long term (current) drug therapy: Secondary | ICD-10-CM | POA: Diagnosis not present

## 2024-01-08 DIAGNOSIS — M810 Age-related osteoporosis without current pathological fracture: Secondary | ICD-10-CM | POA: Insufficient documentation

## 2024-01-08 DIAGNOSIS — Z79811 Long term (current) use of aromatase inhibitors: Secondary | ICD-10-CM | POA: Diagnosis not present

## 2024-01-08 DIAGNOSIS — Z1721 Progesterone receptor positive status: Secondary | ICD-10-CM | POA: Diagnosis not present

## 2024-01-08 DIAGNOSIS — C50812 Malignant neoplasm of overlapping sites of left female breast: Secondary | ICD-10-CM | POA: Insufficient documentation

## 2024-01-08 DIAGNOSIS — Z79631 Long term (current) use of antimetabolite agent: Secondary | ICD-10-CM | POA: Insufficient documentation

## 2024-01-08 DIAGNOSIS — R918 Other nonspecific abnormal finding of lung field: Secondary | ICD-10-CM | POA: Insufficient documentation

## 2024-01-08 DIAGNOSIS — Z170 Estrogen receptor positive status [ER+]: Secondary | ICD-10-CM | POA: Diagnosis not present

## 2024-01-08 MED ORDER — FOLIC ACID 1 MG PO TABS
1.0000 mg | ORAL_TABLET | Freq: Every day | ORAL | Status: AC
Start: 2024-01-08 — End: ?

## 2024-01-08 NOTE — Assessment & Plan Note (Signed)
 12/05/2023:Patient presented with 2 palpable lumps in the left breast 10:00: 2.7 cm with skin tethering, 2:00: 4.2 cm, 3 masses in the left axilla possibly lymph nodes.  Biopsy of the first mass was IDC grade 2, ER 95%, PR 70%, Ki67 20%, HER2 negative; biopsy of second mass grade 3 IDC ER 70%, PR 60%, Ki67 90%, HER2 negative, biopsy of the lymph node grade 3 IDC   12/20/2023: Breast MRI: Left breast: 5.3 cm mass UOQ abuts pectoralis muscle and skin, multiple satellite nodules and non-mass enhancement (entire area 8.8 cm), 3.7 cm irregular mass UIQ abuts pectoralis muscle and skin, 3 cm mass left axilla, mild diffuse skin thickening, 3 lymph nodes enlarged  12/25/2023: CT CAP: Left breast masses and left axillary and subpectoral lymph nodes, multiple small bilateral pulmonary nodules highly suspicious for lung metastases (0.8 cm, 0.8 cm, 1 cm)  01/07/2024: Bone scan: Negative  Treatment plan: Neoadjuvant antiestrogen therapy with anastrozole Mastectomy with targeted node surgery Adjuvant radiation therapy Adjuvant antiestrogen therapy --------------------------------------------------------------------------------------------------------------------------- Discussion regarding scans: I discussed with the patient that the lung nodules are concerning for metastatic disease.  I would like to obtain a PET CT scan for further evaluation. Our goals of treatment might change if these are metastatic disease as we would not be pursuing a mastectomy or adjuvant radiation therapy in that scenario and that she would be treated with anastrozole and Verzinio with palliative intent.

## 2024-01-08 NOTE — Progress Notes (Signed)
 Patient Care Team: Georgianne Fick, MD as PCP - General (Internal Medicine) Donnelly Angelica, RN as Oncology Nurse Navigator Pershing Proud, RN as Oncology Nurse Navigator Serena Croissant, MD as Consulting Physician (Hematology and Oncology) Dorothy Puffer, MD as Consulting Physician (Radiation Oncology) Almond Lint, MD as Consulting Physician (General Surgery)  DIAGNOSIS:  Encounter Diagnosis  Name Primary?   Malignant neoplasm of overlapping sites of left breast in female, estrogen receptor positive (HCC) Yes    SUMMARY OF ONCOLOGIC HISTORY: Oncology History  Malignant neoplasm of overlapping sites of left breast in female, estrogen receptor positive (HCC)  12/05/2023 Initial Diagnosis   Patient presented with 2 palpable lumps in the left breast 10:00: 2.7 cm with skin tethering, 2:00: 4.2 cm, 3 masses in the left axilla possibly lymph nodes.  Biopsy of the first mass was IDC grade 2, ER 95%, PR 70%, Ki67 20%, HER2 negative; biopsy of second mass grade 3 IDC ER 70%, PR 60%, Ki67 90%, HER2 negative, biopsy of the lymph node grade 3 IDC   12/11/2023 Cancer Staging   Staging form: Breast, AJCC 8th Edition - Clinical stage from 12/11/2023: Stage IIB (cT2, cN1, cM0, G3, ER+, PR+, HER2-) - Signed by Serena Croissant, MD on 12/11/2023 Stage prefix: Initial diagnosis Histologic grading system: 3 grade system     CHIEF COMPLIANT: Follow-up to discuss results of scans and on anastrozole  HISTORY OF PRESENT ILLNESS:  History of Present Illness The patient, with a known diagnosis of breast cancer, presents for a follow-up visit to discuss recent imaging studies. The patient has been started on anastrozole, a medication for breast cancer, which she reports is well-tolerated with no side effects. [name] recent imaging studies revealed multiple small lung nodules on both sides, which are highly suspicious for metastasis. However, the patient's bone scan was clear, and no other lymph nodes or  cancer were detected outside of the known breast tumor and lymph nodes under the arm and behind the breast. The patient also had an MRI, which showed a large section of cancer spreading into the breast.     ALLERGIES:  has no known allergies.  MEDICATIONS:  Current Outpatient Medications  Medication Sig Dispense Refill   acetaminophen (TYLENOL) 650 MG CR tablet Take 650 mg by mouth 2 (two) times daily.     alendronate (FOSAMAX) 70 MG tablet Take 70 mg by mouth every Sunday. Take with a full glass of water on an empty stomach.     anastrozole (ARIMIDEX) 1 MG tablet Take 1 tablet (1 mg total) by mouth daily. 90 tablet 3   Calcium Carbonate-Vit D-Min (CALCIUM 1200 PO) Take 1 tablet by mouth 2 (two) times daily.     cholecalciferol (VITAMIN D) 1000 units tablet Take 1,000 Units by mouth daily.     lisinopril (PRINIVIL,ZESTRIL) 10 MG tablet Take 10 mg by mouth daily.     methotrexate (RHEUMATREX) 2.5 MG tablet Take 2.5 mg by mouth once a week. Caution:Chemotherapy. Protect from light.     Multiple Vitamin (MULTIVITAMIN WITH MINERALS) TABS tablet Take 1 tablet by mouth daily.     Multiple Vitamins-Minerals (PRESERVISION AREDS 2+MULTI VIT) CAPS Take 1 capsule by mouth daily.     omega-3 acid ethyl esters (LOVAZA) 1 g capsule Take 1 g by mouth daily.     triamterene-hydrochlorothiazide (MAXZIDE-25) 37.5-25 MG tablet Take 1 tablet by mouth daily.     trolamine salicylate (ASPERCREME) 10 % cream Apply 1 application topically as needed for muscle pain.  No current facility-administered medications for this visit.    PHYSICAL EXAMINATION: ECOG PERFORMANCE STATUS: 1 - Symptomatic but completely ambulatory  Vitals:   01/08/24 1153 01/08/24 1156  BP: (!) 171/76 (!) 173/82  Pulse: 97   Resp: 18   Temp: 98.7 F (37.1 C)   SpO2: 100%    Filed Weights   01/08/24 1153  Weight: 125 lb 11.2 oz (57 kg)      LABORATORY DATA:  I have reviewed the data as listed    Latest Ref Rng & Units  12/11/2023   11:54 AM 10/05/2016    6:19 PM  CMP  Glucose 70 - 99 mg/dL 99  914   BUN 8 - 23 mg/dL 20  22   Creatinine 7.82 - 1.00 mg/dL 9.56  2.13   Sodium 086 - 145 mmol/L 140  138   Potassium 3.5 - 5.1 mmol/L 4.2  3.9   Chloride 98 - 111 mmol/L 105  104   CO2 22 - 32 mmol/L 28    Calcium 8.9 - 10.3 mg/dL 57.8    Total Protein 6.5 - 8.1 g/dL 8.1    Total Bilirubin 0.0 - 1.2 mg/dL 0.4    Alkaline Phos 38 - 126 U/L 35    AST 15 - 41 U/L 39    ALT 0 - 44 U/L 28      Lab Results  Component Value Date   WBC 7.9 12/11/2023   HGB 12.7 12/11/2023   HCT 38.6 12/11/2023   MCV 93.7 12/11/2023   PLT 245 12/11/2023   NEUTROABS 5.4 12/11/2023    ASSESSMENT & PLAN:  Malignant neoplasm of overlapping sites of left breast in female, estrogen receptor positive (HCC) 12/05/2023:Patient presented with 2 palpable lumps in the left breast 10:00: 2.7 cm with skin tethering, 2:00: 4.2 cm, 3 masses in the left axilla possibly lymph nodes.  Biopsy of the first mass was IDC grade 2, ER 95%, PR 70%, Ki67 20%, HER2 negative; biopsy of second mass grade 3 IDC ER 70%, PR 60%, Ki67 90%, HER2 negative, biopsy of the lymph node grade 3 IDC   12/20/2023: Breast MRI: Left breast: 5.3 cm mass UOQ abuts pectoralis muscle and skin, multiple satellite nodules and non-mass enhancement (entire area 8.8 cm), 3.7 cm irregular mass UIQ abuts pectoralis muscle and skin, 3 cm mass left axilla, mild diffuse skin thickening, 3 lymph nodes enlarged  12/25/2023: CT CAP: Left breast masses and left axillary and subpectoral lymph nodes, multiple small bilateral pulmonary nodules highly suspicious for lung metastases (0.8 cm, 0.8 cm, 1 cm)  01/07/2024: Bone scan: Negative  Treatment plan: Neoadjuvant antiestrogen therapy with anastrozole Mastectomy with targeted node surgery Adjuvant radiation therapy Adjuvant antiestrogen  therapy --------------------------------------------------------------------------------------------------------------------------- Discussion regarding scans: I discussed with the patient that the lung nodules are concerning for metastatic disease.  I would like to obtain a PET CT scan for further evaluation. Our goals of treatment might change if these are metastatic disease as we would not be pursuing a mastectomy or adjuvant radiation therapy in that scenario and that she would be treated with anastrozole and Verzinio with palliative intent. Follow-up in 3 weeks to go over the results of PET scan. Assessment & Plan Lung nodules Multiple small lung nodules bilaterally, suspicious for metastasis from breast cancer. Differential includes benign causes. - Order PET scan to differentiate between malignant and benign nodules. - Provide instructions for PET scan preparation and procedure.  Malignant neoplasm of overlapping sites of left breast Breast cancer with  axillary and subpectoral lymph node involvement. Anastrozole well tolerated. PET scan results will guide management: medical management if metastasis, surgical reconsideration if not. - Continue anastrozole therapy. - Consider additional medication if PET scan indicates lung metastasis. - Discuss potential change to medical management if metastasis is confirmed. - Reassess surgical options if no metastasis is found.  Osteoporosis Osteoporosis management with Prolia ongoing. - Continue current osteoporosis management regimen.      No orders of the defined types were placed in this encounter.  The patient has a good understanding of the overall plan. she agrees with it. she will call with any problems that may develop before the next visit here. Total time spent: 30 mins including face to face time and time spent for planning, charting and co-ordination of care   Tamsen Meek, MD 01/08/24

## 2024-01-09 ENCOUNTER — Telehealth: Payer: Self-pay | Admitting: Hematology and Oncology

## 2024-01-09 ENCOUNTER — Encounter: Payer: Self-pay | Admitting: *Deleted

## 2024-01-09 NOTE — Telephone Encounter (Signed)
 Spoke wit pt daughter about scheduled appointment date and time.

## 2024-01-10 ENCOUNTER — Ambulatory Visit: Payer: Self-pay | Admitting: Genetic Counselor

## 2024-01-10 ENCOUNTER — Encounter: Payer: Self-pay | Admitting: Genetic Counselor

## 2024-01-10 DIAGNOSIS — Z1379 Encounter for other screening for genetic and chromosomal anomalies: Secondary | ICD-10-CM | POA: Insufficient documentation

## 2024-01-10 DIAGNOSIS — C50812 Malignant neoplasm of overlapping sites of left female breast: Secondary | ICD-10-CM

## 2024-01-10 DIAGNOSIS — Z8041 Family history of malignant neoplasm of ovary: Secondary | ICD-10-CM

## 2024-01-10 NOTE — Progress Notes (Signed)
 HPI:   Ms. Shew was previously seen in the [facility] due to a personal history of breast cancer and concerns regarding a hereditary predisposition to cancer.    Ms. Reinecke recent genetic test results were disclosed to her daughter, Hollie Salk, by telephone (per Ms. Zajicek's request). These results and recommendations are discussed in more detail below.  CANCER HISTORY:  Oncology History  Malignant neoplasm of overlapping sites of left breast in female, estrogen receptor positive (HCC)  12/05/2023 Initial Diagnosis   Patient presented with 2 palpable lumps in the left breast 10:00: 2.7 cm with skin tethering, 2:00: 4.2 cm, 3 masses in the left axilla possibly lymph nodes.  Biopsy of the first mass was IDC grade 2, ER 95%, PR 70%, Ki67 20%, HER2 negative; biopsy of second mass grade 3 IDC ER 70%, PR 60%, Ki67 90%, HER2 negative, biopsy of the lymph node grade 3 IDC   12/11/2023 Cancer Staging   Staging form: Breast, AJCC 8th Edition - Clinical stage from 12/11/2023: Stage IIB (cT2, cN1, cM0, G3, ER+, PR+, HER2-) - Signed by Serena Croissant, MD on 12/11/2023 Stage prefix: Initial diagnosis Histologic grading system: 3 grade system   01/02/2024 Genetic Testing   Negative [facility] +RNAinsight Panel.  Report date is 01/02/2024.  The [facility] CancerNext+RNAinsight Panel includes sequencing, rearrangement analysis, and RNA analysis for the following 39 genes: APC, ATM, BAP1, BARD1, BMPR1A, BRCA1, BRCA2, BRIP1, CDH1, CDKN2A, CHEK2, FH, FLCN, MET, MLH1, MSH2, MSH6, MUTYH, NF1, NTHL1, PALB2, PMS2, PTEN, RAD51C, RAD51D, SMAD4, STK11, TP53, TSC1, TSC2, and VHL (sequencing and deletion/duplication); AXIN2, HOXB13, MBD4, MSH3, POLD1 and POLE (sequencing only); EPCAM and GREM1 (deletion/duplication only).     FAMILY HISTORY:  We obtained a detailed, 4-generation family history.  Significant diagnoses are listed below: Family History  Problem Relation Age of Onset   Ovarian cancer  Cousin        dx and d. 80s; maternal female cousin   Cancer Cousin        unknown type; maternal female cousin       Ms. Gemme is unaware of previous family history of genetic testing for hereditary cancer risks. There is no reported Ashkenazi Jewish ancestry. There is no known consanguinity.  GENETIC TEST RESULTS:  The [facility] +RNAinsight Panel found no pathogenic mutations.   The [facility] Cancer[facility]+RNAinsight Panel includes sequencing, rearrangement analysis, and RNA analysis for the following 39 genes: APC, ATM, BAP1, BARD1, BMPR1A, BRCA1, BRCA2, BRIP1, CDH1, CDKN2A, CHEK2, FH, FLCN, MET, MLH1, MSH2, MSH6, MUTYH, NF1, NTHL1, PALB2, PMS2, PTEN, RAD51C, RAD51D, SMAD4, STK11, TP53, TSC1, TSC2, and VHL (sequencing and deletion/duplication); AXIN2, HOXB13, MBD4, MSH3, POLD1 and POLE (sequencing only); EPCAM and GREM1 (deletion/duplication only).  The test report has been scanned into EPIC and is located under the Molecular Pathology section of the Results Review tab.  A portion of the result report is included below for reference. Genetic testing reported out on January 02, 2024.       Even though a pathogenic variant was not identified, possible explanations for the cancer in the family may include: There may be no hereditary risk for cancer in the family. The cancers in Ms. Kriegel and/or her family may be sporadic/familial or due to other genetic and environmental factors.  Most cancer is not hereditary.  There may be a gene mutation in one of these genes that current testing methods cannot detect but that chance is small. There could be another gene that has not yet been discovered,  or that we have not yet tested, that is responsible for the cancer diagnoses in the family.  It is also possible there is a hereditary cause for the cancer in the family that Ms. Milhouse did not inherit.   Therefore, it is important to remain in touch with cancer genetics in the future so that we can  continue to offer Ms. Kissel the most up to date genetic testing.    ADDITIONAL GENETIC TESTING:   Ms. Moyd genetic testing was fairly extensive.  If there are additional relevant genes identified to increase cancer risk that can be analyzed in the future, we would be happy to discuss and coordinate this testing at that time.     CANCER SCREENING RECOMMENDATIONS:  Ms. Supak test result is considered negative (normal).  This means that we have not identified a hereditary cause for her personal history of breast cancer at this time.   An individual's cancer risk and medical management are not determined by genetic test results alone. Overall cancer risk assessment incorporates additional factors, including personal medical history, family history, and any available genetic information that may result in a personalized plan for cancer prevention and surveillance. Therefore, it is recommended she continue to follow the cancer management and screening guidelines provided by her oncology and primary healthcare provider.   RECOMMENDATIONS FOR FAMILY MEMBERS:   Since she did not inherit a identifiable mutation in a cancer predisposition gene included on this panel, her children could not have inherited a known mutation from her in one of these genes. Individuals in this family might be at some increased risk of developing cancer, over the general population risk, due to the family history of cancer.  Individuals in the family should notify their providers of the family history of cancer. We recommend women in this family have a yearly mammogram beginning at age 20, or 37 years younger than the earliest onset of cancer, an annual clinical breast exam, and perform monthly breast self-exams.  Risk models that take into account family history and hormonal history may be helpful in determining appropriate breast cancer screening options for family members.  Other members of the family may still carry a  pathogenic variant in one of these genes that Ms. Ruzich did not inherit. Relatives more closely related to her maternal cousin who had ovarian cancer should speak with their providers about genetic counseling/testing.    FOLLOW-UP:  Cancer genetics is a rapidly advancing field and it is possible that new genetic tests will be appropriate for her and/or her family members in the future. We encourage Ms. Maturin to remain in contact with cancer genetics, so we can update her personal and family histories and let her know of advances in cancer genetics that may benefit this family.   Our contact number was provided.  They are welcome to call us at anytime with additional questions or concerns.   Tinamarie Przybylski M. Rennie Plowman, MS, [facility] Genetic Counselor [email or web address] (P) [phone]

## 2024-01-17 ENCOUNTER — Encounter (HOSPITAL_COMMUNITY)
Admission: RE | Admit: 2024-01-17 | Discharge: 2024-01-17 | Disposition: A | Discharge status: Home / Self Care | Payer: Medicare (Managed Care) | Source: Ambulatory Visit | Attending: Hematology and Oncology | Admitting: Hematology and Oncology

## 2024-01-17 DIAGNOSIS — C50812 Malignant neoplasm of overlapping sites of left female breast: Secondary | ICD-10-CM | POA: Insufficient documentation

## 2024-01-17 DIAGNOSIS — Z170 Estrogen receptor positive status [ER+]: Secondary | ICD-10-CM | POA: Diagnosis not present

## 2024-01-17 LAB — GLUCOSE, CAPILLARY: Glucose-Capillary: 103 mg/dL — ABNORMAL HIGH (ref 70–99)

## 2024-01-17 MED ORDER — FLUDEOXYGLUCOSE F - 18 (FDG) INJECTION
6.1800 | Freq: Once | INTRAVENOUS | Status: AC
  Administered 2024-01-17: 6.18 via INTRAVENOUS

## 2024-01-21 ENCOUNTER — Encounter: Payer: Self-pay | Admitting: *Deleted

## 2024-01-29 ENCOUNTER — Inpatient Hospital Stay: Payer: Medicare (Managed Care) | Admitting: Hematology and Oncology

## 2024-01-29 VITALS — BP 170/77 | HR 87 | Temp 98.2°F | Resp 18 | Ht 61.0 in | Wt 126.5 lb

## 2024-01-29 DIAGNOSIS — C50812 Malignant neoplasm of overlapping sites of left female breast: Secondary | ICD-10-CM | POA: Diagnosis not present

## 2024-01-29 DIAGNOSIS — Z170 Estrogen receptor positive status [ER+]: Secondary | ICD-10-CM | POA: Diagnosis not present

## 2024-01-29 NOTE — Progress Notes (Signed)
 Patient Care Team: Virgle Grime, MD as PCP - General (Internal Medicine) Alane Hsu, RN as Oncology Nurse Navigator Auther Bo, RN as Oncology Nurse Navigator Cameron Cea, MD as Consulting Physician (Hematology and Oncology) Johna Myers, MD as Consulting Physician (Radiation Oncology) Lockie Rima, MD as Consulting Physician (General Surgery)  DIAGNOSIS:  Encounter Diagnosis  Name Primary?   Malignant neoplasm of overlapping sites of left breast in female, estrogen receptor positive (HCC) Yes    SUMMARY OF ONCOLOGIC HISTORY: Oncology History  Malignant neoplasm of overlapping sites of left breast in female, estrogen receptor positive (HCC)  12/05/2023 Initial Diagnosis   Patient presented with 2 palpable lumps in the left breast 10:00: 2.7 cm with skin tethering, 2:00: 4.2 cm, 3 masses in the left axilla possibly lymph nodes.  Biopsy of the first mass was IDC grade 2, ER 95%, PR 70%, Ki67 20%, HER2 negative; biopsy of second mass grade 3 IDC ER 70%, PR 60%, Ki67 90%, HER2 negative, biopsy of the lymph node grade 3 IDC   12/11/2023 Cancer Staging   Staging form: Breast, AJCC 8th Edition - Clinical stage from 12/11/2023: Stage IIB (cT2, cN1, cM0, G3, ER+, PR+, HER2-) - Signed by Cameron Cea, MD on 12/11/2023 Stage prefix: Initial diagnosis Histologic grading system: 3 grade system   01/02/2024 Genetic Testing   Negative [facility] +RNAinsight Panel.  Report date is 01/02/2024.  The [facility] CancerNext+RNAinsight Panel includes sequencing, rearrangement analysis, and RNA analysis for the following 39 genes: APC, ATM, BAP1, BARD1, BMPR1A, BRCA1, BRCA2, BRIP1, CDH1, CDKN2A, CHEK2, FH, FLCN, MET, MLH1, MSH2, MSH6, MUTYH, NF1, NTHL1, PALB2, PMS2, PTEN, RAD51C, RAD51D, SMAD4, STK11, TP53, TSC1, TSC2, and VHL (sequencing and deletion/duplication); AXIN2, HOXB13, MBD4, MSH3, POLD1 and POLE (sequencing only); EPCAM and GREM1 (deletion/duplication only).     CHIEF COMPLIANT:  Follow-up to review the PET CT scan  HISTORY OF PRESENT ILLNESS: History of Present Illness The patient, with a history of lung nodules and a chest wall tumor, presents for a follow-up after a PET scan and bone scan. The bone scan showed no abnormalities. The PET scan showed that some of the lung nodules are smaller than before. The patient has been on anastrozole , and it is unclear how much of the reduction in size of the nodules is due to the treatment. The nodules are not large, and the goal of treatment remains uncertain. The patient also has a tumor on the left chest wall, which is sitting on a muscle.     ALLERGIES:  has no known allergies.  MEDICATIONS:  Current Outpatient Medications  Medication Sig Dispense Refill   acetaminophen (TYLENOL) 650 MG CR tablet Take 650 mg by mouth 2 (two) times daily.     alendronate (FOSAMAX) 70 MG tablet Take 70 mg by mouth every Sunday. Take with a full glass of water on an empty stomach.     anastrozole  (ARIMIDEX ) 1 MG tablet Take 1 tablet (1 mg total) by mouth daily. 90 tablet 3   Calcium Carbonate-Vit D-Min (CALCIUM 1200 PO) Take 1 tablet by mouth 2 (two) times daily.     cholecalciferol (VITAMIN D) 1000 units tablet Take 1,000 Units by mouth daily.     folic acid  (FOLVITE ) 1 MG tablet Take 1 tablet (1 mg total) by mouth daily.     lisinopril (PRINIVIL,ZESTRIL) 10 MG tablet Take 10 mg by mouth daily.     methotrexate (RHEUMATREX) 2.5 MG tablet Take 2.5 mg by mouth once a week. Caution:Chemotherapy. Protect from  light.     Multiple Vitamin (MULTIVITAMIN WITH MINERALS) TABS tablet Take 1 tablet by mouth daily.     Multiple Vitamins-Minerals (PRESERVISION AREDS 2+MULTI VIT) CAPS Take 1 capsule by mouth daily.     omega-3 acid ethyl esters (LOVAZA) 1 g capsule Take 1 g by mouth daily.     triamterene-hydrochlorothiazide (MAXZIDE-25) 37.5-25 MG tablet Take 1 tablet by mouth daily.     trolamine salicylate (ASPERCREME) 10 % cream Apply 1 application  topically as needed for muscle pain.     No current facility-administered medications for this visit.    PHYSICAL EXAMINATION: ECOG PERFORMANCE STATUS: 1 - Symptomatic but completely ambulatory  Vitals:   01/29/24 1520 01/29/24 1522  BP: (!) 184/76 (!) 170/77  Pulse: 87   Resp: 18   Temp: 98.2 F (36.8 C)   SpO2: 99%    Filed Weights   01/29/24 1520  Weight: 126 lb 8 oz (57.4 kg)      LABORATORY DATA:  I have reviewed the data as listed    Latest Ref Rng & Units 12/11/2023   11:54 AM 10/05/2016    6:19 PM  CMP  Glucose 70 - 99 mg/dL 99  161   BUN 8 - 23 mg/dL 20  22   Creatinine 0.96 - 1.00 mg/dL 0.45  4.09   Sodium 811 - 145 mmol/L 140  138   Potassium 3.5 - 5.1 mmol/L 4.2  3.9   Chloride 98 - 111 mmol/L 105  104   CO2 22 - 32 mmol/L 28    Calcium 8.9 - 10.3 mg/dL 91.4    Total Protein 6.5 - 8.1 g/dL 8.1    Total Bilirubin 0.0 - 1.2 mg/dL 0.4    Alkaline Phos 38 - 126 U/L 35    AST 15 - 41 U/L 39    ALT 0 - 44 U/L 28      Lab Results  Component Value Date   WBC 7.9 12/11/2023   HGB 12.7 12/11/2023   HCT 38.6 12/11/2023   MCV 93.7 12/11/2023   PLT 245 12/11/2023   NEUTROABS 5.4 12/11/2023    ASSESSMENT & PLAN:  Malignant neoplasm of overlapping sites of left breast in female, estrogen receptor positive (HCC) 12/05/2023:Patient presented with 2 palpable lumps in the left breast 10:00: 2.7 cm with skin tethering, 2:00: 4.2 cm, 3 masses in the left axilla possibly lymph nodes.  Biopsy of the first mass was IDC grade 2, ER 95%, PR 70%, Ki67 20%, HER2 negative; biopsy of second mass grade 3 IDC ER 70%, PR 60%, Ki67 90%, HER2 negative, biopsy of the lymph node grade 3 IDC    12/20/2023: Breast MRI: Left breast: 5.3 cm mass UOQ abuts pectoralis muscle and skin, multiple satellite nodules and non-mass enhancement (entire area 8.8 cm), 3.7 cm irregular mass UIQ abuts pectoralis muscle and skin, 3 cm mass left axilla, mild diffuse skin thickening, 3 lymph nodes enlarged    12/25/2023: CT CAP: Left breast masses and left axillary and subpectoral lymph nodes, multiple small bilateral pulmonary nodules highly suspicious for lung metastases (0.8 cm, 0.8 cm, 1 cm)   01/07/2024: Bone scan: Negative   Treatment plan: Neoadjuvant antiestrogen therapy with anastrozole  Mastectomy with targeted node surgery Adjuvant radiation therapy Adjuvant antiestrogen therapy ----------------------------------------------------------------------------------------- 01/20/2024: PET/CT: Hypermetabolic left breast cancer, soft tissue nodule abutting left pectoralis major is smaller, several hypermetabolic nodes left axilla and supraclavicular are slightly smaller, left upper mediastinal lymph node, small lung nodules some appear to be smaller  01/07/2024: Bone scan: Normal  Continue with neoadjuvant antiestrogen therapy with recheck CT CAP in 3 months and follow-up after that.  ------------------------------------- Assessment and Plan Assessment & Plan Malignant neoplasm of overlapping sites of left breast PET scan shows hypermetabolic lesion in left chest wall with pectoral muscle involvement. Anastrozole  effective, tumor improved. Goal: reduce tumor size and muscle involvement for surgical resection. - Continue anastrozole  1 mg orally daily. - Schedule PET scan in six months to assess treatment response. - Consult with surgeon if tumor size and muscle involvement decrease.  Lung nodules PET scan shows reduction in size of some lung nodules, indicating favorable treatment response. Nodules small, not clearly visible, warranting observation. - Order CT scan in three months to evaluate lung nodules.       Orders Placed This Encounter  Procedures   CT CHEST ABDOMEN PELVIS W CONTRAST    Standing Status:   Future    Expected Date:   04/29/2024    Expiration Date:   01/28/2025    If indicated for the ordered procedure, I authorize the administration of contrast media per Radiology  protocol:   Yes    Does the patient have a contrast media/X-ray dye allergy?:   No    Preferred imaging location?:   [facility]    Release to patient:   Immediate    If indicated for the ordered procedure, I authorize the administration of oral contrast media per Radiology protocol:   Yes   CBC with Differential (Cancer Center Only)    Standing Status:   Future    Expiration Date:   01/28/2025   CMP (Cancer Center only)    Standing Status:   Future    Expiration Date:   01/28/2025   CA 27.29    Standing Status:   Future    Expiration Date:   01/28/2025   The patient has a good understanding of the overall plan. she agrees with it. she will call with any problems that may develop before the next visit here. Total time spent: 40 mins including face to face time and time spent for planning, charting and co-ordination of care   Viinay K Draken Farrior, MD 01/29/24

## 2024-01-29 NOTE — Assessment & Plan Note (Signed)
 12/05/2023:Patient presented with 2 palpable lumps in the left breast 10:00: 2.7 cm with skin tethering, 2:00: 4.2 cm, 3 masses in the left axilla possibly lymph nodes.  Biopsy of the first mass was IDC grade 2, ER 95%, PR 70%, Ki67 20%, HER2 negative; biopsy of second mass grade 3 IDC ER 70%, PR 60%, Ki67 90%, HER2 negative, biopsy of the lymph node grade 3 IDC    12/20/2023: Breast MRI: Left breast: 5.3 cm mass UOQ abuts pectoralis muscle and skin, multiple satellite nodules and non-mass enhancement (entire area 8.8 cm), 3.7 cm irregular mass UIQ abuts pectoralis muscle and skin, 3 cm mass left axilla, mild diffuse skin thickening, 3 lymph nodes enlarged   12/25/2023: CT CAP: Left breast masses and left axillary and subpectoral lymph nodes, multiple small bilateral pulmonary nodules highly suspicious for lung metastases (0.8 cm, 0.8 cm, 1 cm)   01/07/2024: Bone scan: Negative   Treatment plan: Neoadjuvant antiestrogen therapy with anastrozole  Mastectomy with targeted node surgery Adjuvant radiation therapy Adjuvant antiestrogen therapy ----------------------------------------------------------------------------------------- 01/20/2024: PET/CT: Hypermetabolic left breast cancer, soft tissue nodule abutting left pectoralis major is smaller, several hypermetabolic nodes left axilla and supraclavicular are slightly smaller, left upper mediastinal lymph node, small lung nodules some appear to be smaller 01/07/2024: Bone scan: Normal  Continue with neoadjuvant antiestrogen therapy with recheck of her mammogram in 3 months.

## 2024-01-30 ENCOUNTER — Encounter: Payer: Self-pay | Admitting: *Deleted

## 2024-02-20 DIAGNOSIS — Z79899 Other long term (current) drug therapy: Secondary | ICD-10-CM | POA: Diagnosis not present

## 2024-03-06 ENCOUNTER — Encounter: Payer: Self-pay | Admitting: *Deleted

## 2024-03-31 DIAGNOSIS — M810 Age-related osteoporosis without current pathological fracture: Secondary | ICD-10-CM | POA: Diagnosis not present

## 2024-04-21 ENCOUNTER — Telehealth: Payer: Self-pay | Admitting: Hematology and Oncology

## 2024-04-21 NOTE — Telephone Encounter (Signed)
 Called to reschedule patient appointment to due provider pal  request. I talked  to patient and they are aware of the changes that was made to the upcoming appointment

## 2024-04-29 ENCOUNTER — Encounter (HOSPITAL_COMMUNITY): Payer: Self-pay

## 2024-04-29 ENCOUNTER — Ambulatory Visit (HOSPITAL_COMMUNITY)
Admission: RE | Admit: 2024-04-29 | Discharge: 2024-04-29 | Disposition: A | Discharge status: Home / Self Care | Payer: Medicare (Managed Care) | Source: Ambulatory Visit | Attending: Hematology and Oncology | Admitting: Hematology and Oncology

## 2024-04-29 ENCOUNTER — Inpatient Hospital Stay: Payer: Medicare (Managed Care) | Attending: Hematology and Oncology

## 2024-04-29 DIAGNOSIS — C50812 Malignant neoplasm of overlapping sites of left female breast: Secondary | ICD-10-CM | POA: Insufficient documentation

## 2024-04-29 DIAGNOSIS — Z170 Estrogen receptor positive status [ER+]: Secondary | ICD-10-CM | POA: Insufficient documentation

## 2024-04-29 LAB — CBC WITH DIFFERENTIAL (CANCER CENTER ONLY)
Abs Immature Granulocytes: 0.04 K/uL (ref 0.00–0.07)
Basophils Absolute: 0.1 K/uL (ref 0.0–0.1)
Basophils Relative: 1 %
Eosinophils Absolute: 0.1 K/uL (ref 0.0–0.5)
Eosinophils Relative: 1 %
HCT: 35.9 % — ABNORMAL LOW (ref 36.0–46.0)
Hemoglobin: 12.4 g/dL (ref 12.0–15.0)
Immature Granulocytes: 1 %
Lymphocytes Relative: 20 %
Lymphs Abs: 1.5 K/uL (ref 0.7–4.0)
MCH: 30.8 pg (ref 26.0–34.0)
MCHC: 34.5 g/dL (ref 30.0–36.0)
MCV: 89.1 fL (ref 80.0–100.0)
Monocytes Absolute: 0.8 K/uL (ref 0.1–1.0)
Monocytes Relative: 11 %
Neutro Abs: 5 K/uL (ref 1.7–7.7)
Neutrophils Relative %: 66 %
Platelet Count: 250 K/uL (ref 150–400)
RBC: 4.03 MIL/uL (ref 3.87–5.11)
RDW: 14 % (ref 11.5–15.5)
WBC Count: 7.4 K/uL (ref 4.0–10.5)
nRBC: 0 % (ref 0.0–0.2)

## 2024-04-29 LAB — CMP (CANCER CENTER ONLY)
ALT: 11 U/L (ref 0–44)
AST: 20 U/L (ref 15–41)
Albumin: 4 g/dL (ref 3.5–5.0)
Alkaline Phosphatase: 40 U/L (ref 38–126)
Anion gap: 8 (ref 5–15)
BUN: 16 mg/dL (ref 8–23)
CO2: 25 mmol/L (ref 22–32)
Calcium: 9.5 mg/dL (ref 8.9–10.3)
Chloride: 107 mmol/L (ref 98–111)
Creatinine: 0.93 mg/dL (ref 0.44–1.00)
GFR, Estimated: >60 mL/min (ref >60)
Glucose, Bld: 120 mg/dL — ABNORMAL HIGH (ref 70–99)
Potassium: 4 mmol/L (ref 3.5–5.1)
Sodium: 140 mmol/L (ref 135–145)
Total Bilirubin: 0.4 mg/dL (ref 0.0–1.2)
Total Protein: 7.6 g/dL (ref 6.5–8.1)

## 2024-04-29 MED ORDER — IOHEXOL 300 MG/ML  SOLN
100.0000 mL | Freq: Once | INTRAMUSCULAR | Status: AC | PRN
  Administered 2024-04-29: 100 mL via INTRAVENOUS

## 2024-04-30 ENCOUNTER — Encounter: Payer: Self-pay | Admitting: *Deleted

## 2024-04-30 LAB — CANCER ANTIGEN 27.29: CA 27.29: <9 U/mL (ref 0.0–38.6)

## 2024-05-06 ENCOUNTER — Inpatient Hospital Stay: Payer: Medicare (Managed Care) | Admitting: Hematology and Oncology

## 2024-05-19 NOTE — Assessment & Plan Note (Signed)
 12/05/2023:Patient presented with 2 palpable lumps in the left breast 10:00: 2.7 cm with skin tethering, 2:00: 4.2 cm, 3 masses in the left axilla possibly lymph nodes.  Biopsy of the first mass was IDC grade 2, ER 95%, PR 70%, Ki67 20%, HER2 negative; biopsy of second mass grade 3 IDC ER 70%, PR 60%, Ki67 90%, HER2 negative, biopsy of the lymph node grade 3 IDC    12/20/2023: Breast MRI: Left breast: 5.3 cm mass UOQ abuts pectoralis muscle and skin, multiple satellite nodules and non-mass enhancement (entire area 8.8 cm), 3.7 cm irregular mass UIQ abuts pectoralis muscle and skin, 3 cm mass left axilla, mild diffuse skin thickening, 3 lymph nodes enlarged   12/25/2023: CT CAP: Left breast masses and left axillary and subpectoral lymph nodes, multiple small bilateral pulmonary nodules highly suspicious for lung metastases (0.8 cm, 0.8 cm, 1 cm)   01/07/2024: Bone scan: Negative   Treatment plan: Neoadjuvant antiestrogen therapy with anastrozole  Mastectomy with targeted node surgery Adjuvant radiation therapy Adjuvant antiestrogen therapy ----------------------------------------------------------------------------------------- 01/20/2024: PET/CT: Hypermetabolic left breast cancer, soft tissue nodule abutting left pectoralis major is smaller, several hypermetabolic nodes left axilla and supraclavicular are slightly smaller, left upper mediastinal lymph node, small lung nodules some appear to be smaller 01/07/2024: Bone scan: Normal 05/05/24: Stable Left breast mass. Soft tissue mass left axilla/ lateral margin of pectoralis (similar to before), sable left lower paratracheal LN.

## 2024-05-20 ENCOUNTER — Inpatient Hospital Stay: Payer: Medicare (Managed Care) | Attending: Hematology and Oncology | Admitting: Hematology and Oncology

## 2024-05-20 VITALS — BP 128/74 | HR 98 | Temp 98.2°F | Resp 18 | Wt 125.1 lb

## 2024-05-20 DIAGNOSIS — Z79811 Long term (current) use of aromatase inhibitors: Secondary | ICD-10-CM | POA: Diagnosis not present

## 2024-05-20 DIAGNOSIS — Z170 Estrogen receptor positive status [ER+]: Secondary | ICD-10-CM | POA: Diagnosis not present

## 2024-05-20 DIAGNOSIS — Z79631 Long term (current) use of antimetabolite agent: Secondary | ICD-10-CM | POA: Diagnosis not present

## 2024-05-20 DIAGNOSIS — Z79899 Other long term (current) drug therapy: Secondary | ICD-10-CM | POA: Diagnosis not present

## 2024-05-20 DIAGNOSIS — R918 Other nonspecific abnormal finding of lung field: Secondary | ICD-10-CM | POA: Insufficient documentation

## 2024-05-20 DIAGNOSIS — Z1721 Progesterone receptor positive status: Secondary | ICD-10-CM | POA: Diagnosis not present

## 2024-05-20 DIAGNOSIS — Z1732 Human epidermal growth factor receptor 2 negative status: Secondary | ICD-10-CM | POA: Diagnosis not present

## 2024-05-20 DIAGNOSIS — C50812 Malignant neoplasm of overlapping sites of left female breast: Secondary | ICD-10-CM | POA: Insufficient documentation

## 2024-05-20 NOTE — Progress Notes (Signed)
 Patient Care Team: Verdia Lombard, MD as PCP - General (Internal Medicine) Tyree Nanetta SAILOR, RN as Oncology Nurse Navigator Glean, Stephane BROCKS, RN (Inactive) as Oncology Nurse Navigator Odean Potts, MD as Consulting Physician (Hematology and Oncology) Dewey Rush, MD as Consulting Physician (Radiation Oncology) Aron Shoulders, MD as Consulting Physician (General Surgery)  DIAGNOSIS:  Encounter Diagnosis  Name Primary?   Malignant neoplasm of overlapping sites of left breast in female, estrogen receptor positive (HCC) Yes    SUMMARY OF ONCOLOGIC HISTORY: Oncology History  Malignant neoplasm of overlapping sites of left breast in female, estrogen receptor positive (HCC)  12/05/2023 Initial Diagnosis   Patient presented with 2 palpable lumps in the left breast 10:00: 2.7 cm with skin tethering, 2:00: 4.2 cm, 3 masses in the left axilla possibly lymph nodes.  Biopsy of the first mass was IDC grade 2, ER 95%, PR 70%, Ki67 20%, HER2 negative; biopsy of second mass grade 3 IDC ER 70%, PR 60%, Ki67 90%, HER2 negative, biopsy of the lymph node grade 3 IDC   12/11/2023 Cancer Staging   Staging form: Breast, AJCC 8th Edition - Clinical stage from 12/11/2023: Stage IIB (cT2, cN1, cM0, G3, ER+, PR+, HER2-) - Signed by Odean Potts, MD on 12/11/2023 Stage prefix: Initial diagnosis Histologic grading system: 3 grade system   01/02/2024 Genetic Testing   Negative [facility] +RNAinsight Panel.  Report date is 01/02/2024.  The [facility] CancerNext+RNAinsight Panel includes sequencing, rearrangement analysis, and RNA analysis for the following 39 genes: APC, ATM, BAP1, BARD1, BMPR1A, BRCA1, BRCA2, BRIP1, CDH1, CDKN2A, CHEK2, FH, FLCN, MET, MLH1, MSH2, MSH6, MUTYH, NF1, NTHL1, PALB2, PMS2, PTEN, RAD51C, RAD51D, SMAD4, STK11, TP53, TSC1, TSC2, and VHL (sequencing and deletion/duplication); AXIN2, HOXB13, MBD4, MSH3, POLD1 and POLE (sequencing only); EPCAM and GREM1 (deletion/duplication only).     CHIEF  COMPLIANT: Follow-up to discuss results of CT scans on neoadjuvant antiestrogen therapy  HISTORY OF PRESENT ILLNESS:   History of Present Illness Deerica Waszak is an 88 year old female who presents for follow-up of her hormone therapy treatment for breast and lung nodules.  She has been undergoing hormone therapy for breast and lung nodules since April 2025. Recent CT scans show stable size of the breast and lung nodules, with no increase in size. The nodules have not resolved but have not increased either.  The CT scan report indicates a lymph node in the middle of the chest measuring 2.5 cm, which is stable in size. Under the left arm, a lymph node has decreased slightly from 1.9 cm to 1.8 cm. In the breast, the tumor measures 2.4 by 3.3 cm, with a previous measurement of 2.7 by 3.1 cm, indicating a slight change in shape but not in overall size.  She is tolerating the medication well.     ALLERGIES:  has no known allergies.  MEDICATIONS:  Current Outpatient Medications  Medication Sig Dispense Refill   anastrozole  (ARIMIDEX ) 1 MG tablet Take 1 tablet (1 mg total) by mouth daily. 90 tablet 3   Calcium Carbonate-Vit D-Min (CALCIUM 1200 PO) Take 1 tablet by mouth 2 (two) times daily.     cholecalciferol (VITAMIN D) 1000 units tablet Take 1,000 Units by mouth daily.     folic acid  (FOLVITE ) 1 MG tablet Take 1 tablet (1 mg total) by mouth daily.     lisinopril (PRINIVIL,ZESTRIL) 10 MG tablet Take 10 mg by mouth daily.     methotrexate (RHEUMATREX) 2.5 MG tablet Take 2.5 mg by mouth once a week.  Caution:Chemotherapy. Protect from light.     Multiple Vitamin (MULTIVITAMIN WITH MINERALS) TABS tablet Take 1 tablet by mouth daily.     Multiple Vitamins-Minerals (PRESERVISION AREDS 2+MULTI VIT) CAPS Take 1 capsule by mouth daily.     omega-3 acid ethyl esters (LOVAZA) 1 g capsule Take 1 g by mouth daily.     No current facility-administered medications for this visit.    PHYSICAL  EXAMINATION: ECOG PERFORMANCE STATUS: 1 - Symptomatic but completely ambulatory  Vitals:   05/20/24 0850  BP: 128/74  Pulse: 98  Resp: 18  Temp: 98.2 F (36.8 C)  SpO2: 98%   Filed Weights   05/20/24 0850  Weight: 125 lb 1.6 oz (56.7 kg)      LABORATORY DATA:  I have reviewed the data as listed    Latest Ref Rng & Units 04/29/2024    8:43 AM 12/11/2023   11:54 AM 10/05/2016    6:19 PM  CMP  Glucose 70 - 99 mg/dL 879  99  883   BUN 8 - 23 mg/dL 16  20  22    Creatinine 0.44 - 1.00 mg/dL 9.06  9.03  9.09   Sodium 135 - 145 mmol/L 140  140  138   Potassium 3.5 - 5.1 mmol/L 4.0  4.2  3.9   Chloride 98 - 111 mmol/L 107  105  104   CO2 22 - 32 mmol/L 25  28    Calcium 8.9 - 10.3 mg/dL 9.5  89.8    Total Protein 6.5 - 8.1 g/dL 7.6  8.1    Total Bilirubin 0.0 - 1.2 mg/dL 0.4  0.4    Alkaline Phos 38 - 126 U/L 40  35    AST 15 - 41 U/L 20  39    ALT 0 - 44 U/L 11  28      Lab Results  Component Value Date   WBC 7.4 04/29/2024   HGB 12.4 04/29/2024   HCT 35.9 (L) 04/29/2024   MCV 89.1 04/29/2024   PLT 250 04/29/2024   NEUTROABS 5.0 04/29/2024    ASSESSMENT & PLAN:  Malignant neoplasm of overlapping sites of left breast in female, estrogen receptor positive (HCC) 12/05/2023:Patient presented with 2 palpable lumps in the left breast 10:00: 2.7 cm with skin tethering, 2:00: 4.2 cm, 3 masses in the left axilla possibly lymph nodes.  Biopsy of the first mass was IDC grade 2, ER 95%, PR 70%, Ki67 20%, HER2 negative; biopsy of second mass grade 3 IDC ER 70%, PR 60%, Ki67 90%, HER2 negative, biopsy of the lymph node grade 3 IDC    12/20/2023: Breast MRI: Left breast: 5.3 cm mass UOQ abuts pectoralis muscle and skin, multiple satellite nodules and non-mass enhancement (entire area 8.8 cm), 3.7 cm irregular mass UIQ abuts pectoralis muscle and skin, 3 cm mass left axilla, mild diffuse skin thickening, 3 lymph nodes enlarged   12/25/2023: CT CAP: Left breast masses and left axillary and  subpectoral lymph nodes, multiple small bilateral pulmonary nodules highly suspicious for lung metastases (0.8 cm, 0.8 cm, 1 cm)   01/07/2024: Bone scan: Negative   Treatment plan: Neoadjuvant antiestrogen therapy with anastrozole  Mastectomy with targeted node surgery Adjuvant radiation therapy Adjuvant antiestrogen therapy ----------------------------------------------------------------------------------------- 01/20/2024: PET/CT: Hypermetabolic left breast cancer, soft tissue nodule abutting left pectoralis major is smaller, several hypermetabolic nodes left axilla and supraclavicular are slightly smaller, left upper mediastinal lymph node, small lung nodules some appear to be smaller 01/07/2024: Bone scan: Normal 05/05/24: Stable Left  breast mass. Soft tissue mass left axilla/ lateral margin of pectoralis (similar to before), stable left lower paratracheal LN. lung nodules: Stable   Plan: Recheck CT chest and a mammogram and ultrasound in 3 months and make a decision regarding surgery. Anastrozole  toxicities: None Assessment & Plan Estrogen receptor positive malignant neoplasm of overlapping sites of left breast with stable intrathoracic and axillary lymphadenopathy and stable lung nodules CT scan showed stable breast and lung nodules. Chest lymph node stable at 2.5 cm. Left axillary lymph node slightly reduced. Breast tumor slightly changed in shape. PET scan showed no activity, questioning nodules' malignancy. Hormone therapy ongoing for four months. - Continue hormone therapy for three months. - Order CT chest in three months. - Order mammogram and ultrasound in three months. - Refer to surgeon for surgical options discussion after three months. - Schedule follow-up appointment one week after CT scan to review results and decide on further management.      No orders of the defined types were placed in this encounter.  The patient has a good understanding of the overall plan. she agrees  with it. she will call with any problems that may develop before the next visit here. Total time spent: 30 mins including face to face time and time spent for planning, charting and co-ordination of care   Naomi MARLA Chad, MD 05/20/24

## 2024-05-23 ENCOUNTER — Encounter: Payer: Self-pay | Admitting: Hematology and Oncology

## 2024-05-28 ENCOUNTER — Encounter: Payer: Self-pay | Admitting: Hematology and Oncology

## 2024-06-01 ENCOUNTER — Inpatient Hospital Stay (HOSPITAL_BASED_OUTPATIENT_CLINIC_OR_DEPARTMENT_OTHER): Payer: Medicare (Managed Care) | Admitting: Adult Health

## 2024-06-01 ENCOUNTER — Encounter: Payer: Self-pay | Admitting: Adult Health

## 2024-06-01 ENCOUNTER — Ambulatory Visit: Payer: Self-pay

## 2024-06-01 ENCOUNTER — Ambulatory Visit (HOSPITAL_COMMUNITY)
Admission: RE | Admit: 2024-06-01 | Discharge: 2024-06-01 | Disposition: A | Discharge status: Home / Self Care | Payer: Medicare (Managed Care) | Source: Ambulatory Visit | Attending: Adult Health | Admitting: Adult Health

## 2024-06-01 VITALS — BP 188/68 | HR 86 | Temp 97.6°F | Resp 17 | Ht 61.0 in | Wt 125.2 lb

## 2024-06-01 DIAGNOSIS — N1831 Chronic kidney disease, stage 3a: Secondary | ICD-10-CM | POA: Insufficient documentation

## 2024-06-01 DIAGNOSIS — C50812 Malignant neoplasm of overlapping sites of left female breast: Secondary | ICD-10-CM | POA: Insufficient documentation

## 2024-06-01 DIAGNOSIS — Z170 Estrogen receptor positive status [ER+]: Secondary | ICD-10-CM | POA: Diagnosis not present

## 2024-06-01 DIAGNOSIS — M810 Age-related osteoporosis without current pathological fracture: Secondary | ICD-10-CM | POA: Insufficient documentation

## 2024-06-01 DIAGNOSIS — I10 Essential (primary) hypertension: Secondary | ICD-10-CM | POA: Insufficient documentation

## 2024-06-01 DIAGNOSIS — M150 Primary generalized (osteo)arthritis: Secondary | ICD-10-CM | POA: Insufficient documentation

## 2024-06-01 DIAGNOSIS — M1990 Unspecified osteoarthritis, unspecified site: Secondary | ICD-10-CM | POA: Insufficient documentation

## 2024-06-01 NOTE — Telephone Encounter (Signed)
-----   Message from Morna JAYSON Kendall sent at 06/01/2024  1:37 PM EDT ----- Please let patient know her xrays did not show cancer.  They showed some degeneration in the lumbar spine but nothing else.  Continue with plan as we discussed, and we will touch base in 2 weeks for  f/u.   ----- Message ----- From: Interface, Rad Results In Sent: 06/01/2024  11:45 AM EDT To: Morna Dalton Kendall, NP

## 2024-06-01 NOTE — Telephone Encounter (Signed)
 Phone call to [employer]

## 2024-06-01 NOTE — Telephone Encounter (Signed)
-----   Message from Andrea CHRISTELLA Plunk, RN sent at 06/01/2024  4:05 PM EDT -----   ----- Message ----- From: Crawford Morna Pickle, NP Sent: 06/01/2024   1:38 PM EDT To: [facility]  Please let patient know her xrays did not show cancer.  They showed some degeneration in the lumbar spine but nothing else.  Continue with plan as we discussed, and we will touch base in 2 weeks for  f/u.   ----- Message ----- From: Interface, Rad Results In Sent: 06/01/2024  11:45 AM EDT To: Morna Pickle Crawford, NP

## 2024-06-02 NOTE — Progress Notes (Signed)
 [facility] Cancer Follow up:    Katelyn Lombard, MD [address] [place] [address]   DIAGNOSIS:  Cancer Staging  Malignant neoplasm of overlapping sites of left breast in female, estrogen receptor positive (HCC) Staging form: Breast, AJCC 8th Edition - Clinical stage from 12/11/2023: Stage IIB (cT2, cN1, cM0, G3, ER+, PR+, HER2-) - Signed by Odean Potts, MD on 12/11/2023 Stage prefix: Initial diagnosis Histologic grading system: 3 grade system    SUMMARY OF ONCOLOGIC HISTORY: Oncology History  Malignant neoplasm of overlapping sites of left breast in female, estrogen receptor positive (HCC)  12/05/2023 Initial Diagnosis   Patient presented with 2 palpable lumps in the left breast 10:00: 2.7 cm with skin tethering, 2:00: 4.2 cm, 3 masses in the left axilla possibly lymph nodes.  Biopsy of the first mass was IDC grade 2, ER 95%, PR 70%, Ki67 20%, HER2 negative; biopsy of second mass grade 3 IDC ER 70%, PR 60%, Ki67 90%, HER2 negative, biopsy of the lymph node grade 3 IDC   12/11/2023 Cancer Staging   Staging form: Breast, AJCC 8th Edition - Clinical stage from 12/11/2023: Stage IIB (cT2, cN1, cM0, G3, ER+, PR+, HER2-) - Signed by Odean Potts, MD on 12/11/2023 Stage prefix: Initial diagnosis Histologic grading system: 3 grade system   01/02/2024 Genetic Testing   Negative [facility] CancerNext +RNAinsight Panel.  Report date is 01/02/2024.  The [facility] CancerNext+RNAinsight Panel includes sequencing, rearrangement analysis, and RNA analysis for the following 39 genes: APC, ATM, BAP1, BARD1, BMPR1A, BRCA1, BRCA2, BRIP1, CDH1, CDKN2A, CHEK2, FH, FLCN, MET, MLH1, MSH2, MSH6, MUTYH, NF1, NTHL1, PALB2, PMS2, PTEN, RAD51C, RAD51D, SMAD4, STK11, TP53, TSC1, TSC2, and VHL (sequencing and deletion/duplication); AXIN2, HOXB13, MBD4, MSH3, POLD1 and POLE (sequencing only); EPCAM and GREM1 (deletion/duplication only).   12/2023 -  Anti-estrogen oral therapy   Anastrozole   daily   01/20/2024 PET scan   PET/CT: Hypermetabolic left breast cancer, soft tissue nodule abutting left pectoralis major is smaller, several hypermetabolic nodes left axilla and supraclavicular are slightly smaller, left upper mediastinal lymph node, small lung nodules some appear to be smaller      CURRENT THERAPY: Anastrozole   INTERVAL HISTORY:  Discussed the use of AI scribe software for clinical note transcription with the patient, who gave verbal consent to proceed.  History of Present Illness Katelyn Fitzgerald is an 88 year old female who was diagnosed with questionably metastatic invasive ductal carcinoma in March 2025. A CT scan on April 29, 2024, showed left axillary adenopathy measuring 1.2 by 1.8 cm and a left breast mass measuring 2.4 by 3.3 cm, there were small pulmonary nodules questionably malignant.   She experiences significant, constant left-sided pain affecting her back and legs, worsening over time and impacting her sleep. The pain persists despite discontinuing anastrozole  a week ago and using Tylenol, which has been ineffective. She occasionally feels pain under her arm and in her breast but reports no new or worsening pain in these areas. The pain sometimes necessitates holding onto walls for support. She does not use a walker but finds the pain significant. She notes her left breast mass is worse.      Patient Active Problem List   Diagnosis Date Noted   Essential hypertension 06/01/2024   Osteoarthritis 06/01/2024   Osteoporosis 06/01/2024   Primary osteoarthritis involving multiple joints 06/01/2024   Stage 3a chronic kidney disease (HCC) 06/01/2024   Genetic testing 01/10/2024   Rheumatoid arthritis involving multiple sites (HCC) 12/11/2023   Methotrexate,  long term, current use 12/11/2023   Malignant neoplasm of overlapping sites of left breast in female, estrogen receptor positive (HCC) 12/10/2023    has no known allergies.  MEDICAL HISTORY: Past Medical  History:  Diagnosis Date   Arthritis    Breast cancer (HCC)    Hypertension    Osteoporosis     SURGICAL HISTORY: Past Surgical History:  Procedure Laterality Date   BREAST BIOPSY Left 12/05/2023   US  LT BREAST BX W LOC DEV EA ADD LESION IMG BX SPEC US  GUIDE 12/05/2023 [facility]   BREAST BIOPSY Left 12/05/2023   US  LT BREAST BX W LOC DEV 1ST LESION IMG BX SPEC US  GUIDE 12/05/2023 [facility]    SOCIAL HISTORY: Social History   Socioeconomic History   Marital status: Married    Spouse name: Not on file   Number of children: Not on file   Years of education: Not on file   Highest education level: Not on file  Occupational History   Not on file  Tobacco Use   Smoking status: Never   Smokeless tobacco: Never  Substance and Sexual Activity   Alcohol use: No   Drug use: Never   Sexual activity: Not on file  Other Topics Concern   Not on file  Social History Narrative   Not on file   Social Drivers of Health   Financial Resource Strain: Not on file  Food Insecurity: No Food Insecurity (12/11/2023)   Hunger Vital Sign    Worried About Running Out of Food in the Last Year: Never true    Ran Out of Food in the Last Year: Never true  Transportation Needs: No Transportation Needs (12/11/2023)   PRAPARE - Administrator, Civil Service (Medical): No    Lack of Transportation (Non-Medical): No  Physical Activity: Not on file  Stress: Not on file  Social Connections: Unknown (12/11/2023)   Social Connection and Isolation Panel    Frequency of Communication with Friends and Family: Never    Frequency of Social Gatherings with Friends and Family: Never    Attends Religious Services: Never    Database administrator or Organizations: No    Attends Banker Meetings: Never    Marital Status: Not on file  Intimate Partner Violence: Not At Risk (12/11/2023)   Humiliation, Afraid, Rape, and Kick questionnaire    Fear of Current or Ex-Partner: No     Emotionally Abused: No    Physically Abused: No    Sexually Abused: No    FAMILY HISTORY: Family History  Problem Relation Age of Onset   Ovarian cancer Cousin        dx and d. 25s; maternal female cousin   Cancer Cousin        unknown type; maternal female cousin    Review of Systems  Constitutional:  Negative for appetite change, chills, fatigue, fever and unexpected weight change.  HENT:   Negative for hearing loss, lump/mass and trouble swallowing.   Eyes:  Negative for eye problems and icterus.  Respiratory:  Negative for chest tightness, cough and shortness of breath.   Cardiovascular:  Negative for chest pain, leg swelling and palpitations.  Gastrointestinal:  Negative for abdominal distention, abdominal pain, constipation, diarrhea, nausea and vomiting.  Endocrine: Negative for hot flashes.  Genitourinary:  Negative for difficulty urinating.   Musculoskeletal:  Negative for arthralgias.  Skin:  Negative for itching and rash.  Neurological:  Negative for dizziness, extremity weakness, headaches  and numbness.  Hematological:  Negative for adenopathy. Does not bruise/bleed easily.  Psychiatric/Behavioral:  Negative for depression. The patient is not nervous/anxious.       PHYSICAL EXAMINATION    Vitals:   06/01/24 0915 06/01/24 0927  BP: (!) 197/93 (!) 188/68  Pulse: 86   Resp: 17   Temp: 97.6 F (36.4 C)   SpO2: 95%     Physical Exam Constitutional:      General: She is not in acute distress.    Appearance: Normal appearance. She is not toxic-appearing.  HENT:     Head: Normocephalic and atraumatic.     Mouth/Throat:     Mouth: Mucous membranes are moist.     Pharynx: Oropharynx is clear. No oropharyngeal exudate or posterior oropharyngeal erythema.  Eyes:     General: No scleral icterus. Chest:     Comments: Left breast mass ? Larger, noted in left upper outer breast Abdominal:     General: Abdomen is flat. Bowel sounds are normal. There is no  distension.     Palpations: Abdomen is soft.     Tenderness: There is no abdominal tenderness.  Musculoskeletal:        General: No swelling.     Cervical back: Neck supple.  Lymphadenopathy:     Cervical: No cervical adenopathy.     Upper Body:     Right upper body: No supraclavicular or axillary adenopathy.     Left upper body: Axillary adenopathy present. No supraclavicular adenopathy.  Skin:    General: Skin is warm and dry.     Findings: No rash.  Neurological:     General: No focal deficit present.     Mental Status: She is alert.  Psychiatric:        Mood and Affect: Mood normal.        Behavior: Behavior normal.        ASSESSMENT and THERAPY PLAN:   Malignant neoplasm of overlapping sites of left breast in female, estrogen receptor positive (HCC) 12/05/2023:Patient presented with 2 palpable lumps in the left breast 10:00: 2.7 cm with skin tethering, 2:00: 4.2 cm, 3 masses in the left axilla possibly lymph nodes.  Biopsy of the first mass was IDC grade 2, ER 95%, PR 70%, Ki67 20%, HER2 negative; biopsy of second mass grade 3 IDC ER 70%, PR 60%, Ki67 90%, HER2 negative, biopsy of the lymph node grade 3 IDC    12/20/2023: Breast MRI: Left breast: 5.3 cm mass UOQ abuts pectoralis muscle and skin, multiple satellite nodules and non-mass enhancement (entire area 8.8 cm), 3.7 cm irregular mass UIQ abuts pectoralis muscle and skin, 3 cm mass left axilla, mild diffuse skin thickening, 3 lymph nodes enlarged   12/25/2023: CT CAP: Left breast masses and left axillary and subpectoral lymph nodes, multiple small bilateral pulmonary nodules highly suspicious for lung metastases (0.8 cm, 0.8 cm, 1 cm)   01/07/2024: Bone scan: Negative   Treatment plan: Neoadjuvant antiestrogen therapy with anastrozole  Mastectomy with targeted node surgery Adjuvant radiation therapy Adjuvant antiestrogen  therapy ----------------------------------------------------------------------------------------- 01/20/2024: PET/CT: Hypermetabolic left breast cancer, soft tissue nodule abutting left pectoralis major is smaller, several hypermetabolic nodes left axilla and supraclavicular are slightly smaller, left upper mediastinal lymph node, small lung nodules some appear to be smaller 01/07/2024: Bone scan: Normal 05/05/24: Stable Left breast mass. Soft tissue mass left axilla/ lateral margin of pectoralis (similar to before), sable left lower paratracheal LN.      Assessment and Plan Assessment & Plan Invasive  ductal carcinoma of left breast, ER/PR positive, stage 2B Stage 2B ER/PR positive invasive ductal carcinoma of the left breast, diagnosed in March 2025. Imaging on April 29, 2024, indicated well-managed left axillary adenopathy and left breast mass. Pain worsened after discontinuing anastrozole . - Restart anastrozole . - Order x-rays to evaluate for bone involvement or other causes of pain. - Expedite mammogram and ultrasound scheduling.  Left-sided musculoskeletal pain, possible bursitis Left-sided musculoskeletal pain, constant and affecting sleep, located in the back and radiating to the legs. Tenderness over the bursa area suggests possible bursitis. Pain worsened after discontinuing anastrozole . - Recommend taking Aleve and Tylenol together for pain management, up to three times a day as needed, with food and plenty of water. - Reassess pain management in two weeks, either via phone or in person.  RTC in 2 weeks for f/u.    All questions were answered. The patient knows to call the clinic with any problems, questions or concerns. We can certainly see the patient much sooner if necessary.  Total encounter time: 30 minutes*in face-to-face visit time, chart review, lab review, care coordination, order entry, and documentation of the encounter time.    Morna Kendall, NP 06/02/24 2:39  PM Medical Oncology and Hematology [facility] [address] [place], [place] [address] Tel. [phone]    Fax. [phone]  *Total Encounter Time as defined by the Centers for Medicare and Medicaid Services includes, in addition to the face-to-face time of a patient visit (documented in the note above) non-face-to-face time: obtaining and reviewing outside history, ordering and reviewing medications, tests or procedures, care coordination (communications with other health care professionals or caregivers) and documentation in the medical record.

## 2024-06-02 NOTE — Assessment & Plan Note (Signed)
 12/05/2023:Patient presented with 2 palpable lumps in the left breast 10:00: 2.7 cm with skin tethering, 2:00: 4.2 cm, 3 masses in the left axilla possibly lymph nodes.  Biopsy of the first mass was IDC grade 2, ER 95%, PR 70%, Ki67 20%, HER2 negative; biopsy of second mass grade 3 IDC ER 70%, PR 60%, Ki67 90%, HER2 negative, biopsy of the lymph node grade 3 IDC    12/20/2023: Breast MRI: Left breast: 5.3 cm mass UOQ abuts pectoralis muscle and skin, multiple satellite nodules and non-mass enhancement (entire area 8.8 cm), 3.7 cm irregular mass UIQ abuts pectoralis muscle and skin, 3 cm mass left axilla, mild diffuse skin thickening, 3 lymph nodes enlarged   12/25/2023: CT CAP: Left breast masses and left axillary and subpectoral lymph nodes, multiple small bilateral pulmonary nodules highly suspicious for lung metastases (0.8 cm, 0.8 cm, 1 cm)   01/07/2024: Bone scan: Negative   Treatment plan: Neoadjuvant antiestrogen therapy with anastrozole  Mastectomy with targeted node surgery Adjuvant radiation therapy Adjuvant antiestrogen therapy ----------------------------------------------------------------------------------------- 01/20/2024: PET/CT: Hypermetabolic left breast cancer, soft tissue nodule abutting left pectoralis major is smaller, several hypermetabolic nodes left axilla and supraclavicular are slightly smaller, left upper mediastinal lymph node, small lung nodules some appear to be smaller 01/07/2024: Bone scan: Normal 05/05/24: Stable Left breast mass. Soft tissue mass left axilla/ lateral margin of pectoralis (similar to before), sable left lower paratracheal LN.

## 2024-06-04 DIAGNOSIS — C50919 Malignant neoplasm of unspecified site of unspecified female breast: Secondary | ICD-10-CM | POA: Diagnosis not present

## 2024-06-04 DIAGNOSIS — R7989 Other specified abnormal findings of blood chemistry: Secondary | ICD-10-CM | POA: Diagnosis not present

## 2024-06-04 DIAGNOSIS — M069 Rheumatoid arthritis, unspecified: Secondary | ICD-10-CM | POA: Diagnosis not present

## 2024-06-04 DIAGNOSIS — Z79899 Other long term (current) drug therapy: Secondary | ICD-10-CM | POA: Diagnosis not present

## 2024-06-04 DIAGNOSIS — N644 Mastodynia: Secondary | ICD-10-CM | POA: Diagnosis not present

## 2024-06-04 DIAGNOSIS — M5386 Other specified dorsopathies, lumbar region: Secondary | ICD-10-CM | POA: Diagnosis not present

## 2024-06-04 DIAGNOSIS — M1990 Unspecified osteoarthritis, unspecified site: Secondary | ICD-10-CM | POA: Diagnosis not present

## 2024-06-04 DIAGNOSIS — M810 Age-related osteoporosis without current pathological fracture: Secondary | ICD-10-CM | POA: Diagnosis not present

## 2024-06-05 ENCOUNTER — Ambulatory Visit
Admission: RE | Admit: 2024-06-05 | Discharge: 2024-06-05 | Disposition: A | Discharge status: Home / Self Care | Payer: Medicare (Managed Care) | Source: Ambulatory Visit | Attending: Hematology and Oncology | Admitting: Hematology and Oncology

## 2024-06-05 ENCOUNTER — Other Ambulatory Visit: Payer: Self-pay | Admitting: Hematology and Oncology

## 2024-06-05 DIAGNOSIS — N6321 Unspecified lump in the left breast, upper outer quadrant: Secondary | ICD-10-CM | POA: Diagnosis not present

## 2024-06-05 DIAGNOSIS — C50812 Malignant neoplasm of overlapping sites of left female breast: Secondary | ICD-10-CM

## 2024-06-05 DIAGNOSIS — R928 Other abnormal and inconclusive findings on diagnostic imaging of breast: Secondary | ICD-10-CM | POA: Diagnosis not present

## 2024-06-05 DIAGNOSIS — Z853 Personal history of malignant neoplasm of breast: Secondary | ICD-10-CM | POA: Diagnosis not present

## 2024-06-15 ENCOUNTER — Other Ambulatory Visit: Payer: Self-pay

## 2024-06-15 ENCOUNTER — Encounter (HOSPITAL_COMMUNITY): Payer: Self-pay

## 2024-06-15 ENCOUNTER — Inpatient Hospital Stay: Payer: Medicare (Managed Care)

## 2024-06-15 ENCOUNTER — Inpatient Hospital Stay (HOSPITAL_COMMUNITY)
Admission: EM | Admit: 2024-06-15 | Discharge: 2024-06-18 | DRG: 378 | Disposition: A | Discharge status: Home / Self Care | Payer: Medicare (Managed Care) | Source: Ambulatory Visit | Attending: Internal Medicine | Admitting: Internal Medicine

## 2024-06-15 ENCOUNTER — Telehealth: Payer: Self-pay

## 2024-06-15 ENCOUNTER — Inpatient Hospital Stay (HOSPITAL_BASED_OUTPATIENT_CLINIC_OR_DEPARTMENT_OTHER): Payer: Medicare (Managed Care) | Admitting: Adult Health

## 2024-06-15 DIAGNOSIS — C50812 Malignant neoplasm of overlapping sites of left female breast: Secondary | ICD-10-CM | POA: Insufficient documentation

## 2024-06-15 DIAGNOSIS — M7062 Trochanteric bursitis, left hip: Secondary | ICD-10-CM | POA: Diagnosis not present

## 2024-06-15 DIAGNOSIS — M5440 Lumbago with sciatica, unspecified side: Secondary | ICD-10-CM | POA: Insufficient documentation

## 2024-06-15 DIAGNOSIS — I10 Essential (primary) hypertension: Secondary | ICD-10-CM | POA: Diagnosis not present

## 2024-06-15 DIAGNOSIS — K264 Chronic or unspecified duodenal ulcer with hemorrhage: Secondary | ICD-10-CM | POA: Diagnosis present

## 2024-06-15 DIAGNOSIS — Z8041 Family history of malignant neoplasm of ovary: Secondary | ICD-10-CM

## 2024-06-15 DIAGNOSIS — R590 Localized enlarged lymph nodes: Secondary | ICD-10-CM | POA: Insufficient documentation

## 2024-06-15 DIAGNOSIS — R918 Other nonspecific abnormal finding of lung field: Secondary | ICD-10-CM | POA: Insufficient documentation

## 2024-06-15 DIAGNOSIS — Z79811 Long term (current) use of aromatase inhibitors: Secondary | ICD-10-CM

## 2024-06-15 DIAGNOSIS — R5383 Other fatigue: Secondary | ICD-10-CM | POA: Insufficient documentation

## 2024-06-15 DIAGNOSIS — Z79631 Long term (current) use of antimetabolite agent: Secondary | ICD-10-CM | POA: Diagnosis not present

## 2024-06-15 DIAGNOSIS — M159 Polyosteoarthritis, unspecified: Secondary | ICD-10-CM | POA: Insufficient documentation

## 2024-06-15 DIAGNOSIS — M069 Rheumatoid arthritis, unspecified: Secondary | ICD-10-CM | POA: Diagnosis not present

## 2024-06-15 DIAGNOSIS — Z79899 Other long term (current) drug therapy: Secondary | ICD-10-CM

## 2024-06-15 DIAGNOSIS — N644 Mastodynia: Secondary | ICD-10-CM | POA: Insufficient documentation

## 2024-06-15 DIAGNOSIS — K922 Gastrointestinal hemorrhage, unspecified: Principal | ICD-10-CM | POA: Diagnosis present

## 2024-06-15 DIAGNOSIS — Z1732 Human epidermal growth factor receptor 2 negative status: Secondary | ICD-10-CM | POA: Insufficient documentation

## 2024-06-15 DIAGNOSIS — M0600 Rheumatoid arthritis without rheumatoid factor, unspecified site: Secondary | ICD-10-CM | POA: Diagnosis not present

## 2024-06-15 DIAGNOSIS — Z1721 Progesterone receptor positive status: Secondary | ICD-10-CM | POA: Insufficient documentation

## 2024-06-15 DIAGNOSIS — Z809 Family history of malignant neoplasm, unspecified: Secondary | ICD-10-CM | POA: Diagnosis not present

## 2024-06-15 DIAGNOSIS — D62 Acute posthemorrhagic anemia: Secondary | ICD-10-CM | POA: Diagnosis present

## 2024-06-15 DIAGNOSIS — N1831 Chronic kidney disease, stage 3a: Secondary | ICD-10-CM | POA: Diagnosis not present

## 2024-06-15 DIAGNOSIS — K2951 Unspecified chronic gastritis with bleeding: Secondary | ICD-10-CM | POA: Diagnosis not present

## 2024-06-15 DIAGNOSIS — K254 Chronic or unspecified gastric ulcer with hemorrhage: Principal | ICD-10-CM | POA: Diagnosis present

## 2024-06-15 DIAGNOSIS — Z170 Estrogen receptor positive status [ER+]: Secondary | ICD-10-CM

## 2024-06-15 DIAGNOSIS — K921 Melena: Secondary | ICD-10-CM | POA: Insufficient documentation

## 2024-06-15 DIAGNOSIS — D509 Iron deficiency anemia, unspecified: Secondary | ICD-10-CM | POA: Diagnosis not present

## 2024-06-15 DIAGNOSIS — K279 Peptic ulcer, site unspecified, unspecified as acute or chronic, without hemorrhage or perforation: Secondary | ICD-10-CM | POA: Diagnosis not present

## 2024-06-15 DIAGNOSIS — I129 Hypertensive chronic kidney disease with stage 1 through stage 4 chronic kidney disease, or unspecified chronic kidney disease: Secondary | ICD-10-CM | POA: Diagnosis not present

## 2024-06-15 DIAGNOSIS — G8929 Other chronic pain: Secondary | ICD-10-CM | POA: Insufficient documentation

## 2024-06-15 DIAGNOSIS — K269 Duodenal ulcer, unspecified as acute or chronic, without hemorrhage or perforation: Secondary | ICD-10-CM | POA: Diagnosis not present

## 2024-06-15 DIAGNOSIS — M810 Age-related osteoporosis without current pathological fracture: Secondary | ICD-10-CM | POA: Diagnosis present

## 2024-06-15 DIAGNOSIS — K259 Gastric ulcer, unspecified as acute or chronic, without hemorrhage or perforation: Secondary | ICD-10-CM | POA: Diagnosis not present

## 2024-06-15 LAB — CBC WITH DIFFERENTIAL (CANCER CENTER ONLY)
Abs Immature Granulocytes: 0.04 K/uL (ref 0.00–0.07)
Basophils Absolute: 0 K/uL (ref 0.0–0.1)
Basophils Relative: 1 %
Eosinophils Absolute: 0 K/uL (ref 0.0–0.5)
Eosinophils Relative: 0 %
HCT: 22.9 % — ABNORMAL LOW (ref 36.0–46.0)
Hemoglobin: 7.8 g/dL — ABNORMAL LOW (ref 12.0–15.0)
Immature Granulocytes: 1 %
Lymphocytes Relative: 14 %
Lymphs Abs: 1.1 K/uL (ref 0.7–4.0)
MCH: 31.1 pg (ref 26.0–34.0)
MCHC: 34.1 g/dL (ref 30.0–36.0)
MCV: 91.2 fL (ref 80.0–100.0)
Monocytes Absolute: 0.5 K/uL (ref 0.1–1.0)
Monocytes Relative: 7 %
Neutro Abs: 6.1 K/uL (ref 1.7–7.7)
Neutrophils Relative %: 77 %
Platelet Count: 254 K/uL (ref 150–400)
RBC: 2.51 MIL/uL — ABNORMAL LOW (ref 3.87–5.11)
RDW: 14.9 % (ref 11.5–15.5)
WBC Count: 7.8 K/uL (ref 4.0–10.5)
nRBC: 0 % (ref 0.0–0.2)

## 2024-06-15 LAB — COMPREHENSIVE METABOLIC PANEL WITH GFR
ALT: 15 U/L (ref 0–44)
AST: 25 U/L (ref 15–41)
Albumin: 3.8 g/dL (ref 3.5–5.0)
Alkaline Phosphatase: 23 U/L — ABNORMAL LOW (ref 38–126)
Anion gap: 8 (ref 5–15)
BUN: 31 mg/dL — ABNORMAL HIGH (ref 8–23)
CO2: 25 mmol/L (ref 22–32)
Calcium: 9.4 mg/dL (ref 8.9–10.3)
Chloride: 105 mmol/L (ref 98–111)
Creatinine, Ser: 0.98 mg/dL (ref 0.44–1.00)
GFR, Estimated: 57 mL/min — ABNORMAL LOW (ref >60)
Glucose, Bld: 136 mg/dL — ABNORMAL HIGH (ref 70–99)
Potassium: 4.1 mmol/L (ref 3.5–5.1)
Sodium: 138 mmol/L (ref 135–145)
Total Bilirubin: 0.3 mg/dL (ref 0.0–1.2)
Total Protein: 6.5 g/dL (ref 6.5–8.1)

## 2024-06-15 LAB — HEMOGLOBIN AND HEMATOCRIT, BLOOD
HCT: 23.3 % — ABNORMAL LOW (ref 36.0–46.0)
HCT: 22.7 % — ABNORMAL LOW (ref 36.0–46.0)
Hemoglobin: 7.5 g/dL — ABNORMAL LOW (ref 12.0–15.0)
Hemoglobin: 7.3 g/dL — ABNORMAL LOW (ref 12.0–15.0)

## 2024-06-15 LAB — MRSA NEXT GEN BY PCR, NASAL: MRSA by PCR Next Gen: NOT DETECTED

## 2024-06-15 LAB — POC OCCULT BLOOD, ED: Fecal Occult Bld: POSITIVE — AB

## 2024-06-15 LAB — ABO/RH: ABO/RH(D): AB POS

## 2024-06-15 LAB — PREPARE RBC (CROSSMATCH)

## 2024-06-15 MED ORDER — PANTOPRAZOLE SODIUM 40 MG IV SOLR: 40.0000 mg | Freq: Once | INTRAVENOUS | Status: DC

## 2024-06-15 MED ORDER — ONDANSETRON HCL 4 MG PO TABS: 4.0000 mg | ORAL_TABLET | Freq: Four times a day (QID) | ORAL | Status: DC | PRN

## 2024-06-15 MED ORDER — SODIUM CHLORIDE 0.9% IV SOLUTION
Freq: Once | INTRAVENOUS | Status: DC
Start: 2024-06-15 — End: 2024-06-18

## 2024-06-15 MED ORDER — HYDRALAZINE HCL 20 MG/ML IJ SOLN
10.0000 mg | INTRAMUSCULAR | Status: DC | PRN
  Administered 2024-06-16 – 2024-06-17 (×2): 10 mg via INTRAVENOUS
  Filled 2024-06-15 (×2): qty 1

## 2024-06-15 MED ORDER — ACETAMINOPHEN 650 MG RE SUPP: 650.0000 mg | Freq: Four times a day (QID) | RECTAL | Status: DC | PRN

## 2024-06-15 MED ORDER — CHLORHEXIDINE GLUCONATE CLOTH 2 % EX PADS
6.0000 | MEDICATED_PAD | Freq: Every day | CUTANEOUS | Status: DC
Start: 2024-06-15 — End: 2024-06-18
  Administered 2024-06-15 – 2024-06-16 (×2): 6 via TOPICAL

## 2024-06-15 MED ORDER — HYDRALAZINE HCL 20 MG/ML IJ SOLN: 10.0000 mg | Freq: Once | INTRAMUSCULAR | Status: DC

## 2024-06-15 MED ORDER — LABETALOL HCL 5 MG/ML IV SOLN: 20.0000 mg | Freq: Once | INTRAVENOUS | Status: DC

## 2024-06-15 MED ORDER — SODIUM CHLORIDE 0.9 % IV SOLN: INTRAVENOUS | Status: DC

## 2024-06-15 MED ORDER — PANTOPRAZOLE SODIUM 40 MG IV SOLR
80.0000 mg | Freq: Once | INTRAVENOUS | Status: AC
Start: 2024-06-15 — End: 2024-06-15
  Administered 2024-06-15: 80 mg via INTRAVENOUS
  Filled 2024-06-15: qty 20

## 2024-06-15 MED ORDER — ORAL CARE MOUTH RINSE: 15.0000 mL | OROMUCOSAL | Status: DC | PRN

## 2024-06-15 MED ORDER — PANTOPRAZOLE SODIUM 40 MG IV SOLR
40.0000 mg | Freq: Two times a day (BID) | INTRAVENOUS | Status: DC
  Administered 2024-06-15 – 2024-06-18 (×6): 40 mg via INTRAVENOUS
  Filled 2024-06-15 (×6): qty 10

## 2024-06-15 MED ORDER — ACETAMINOPHEN 325 MG PO TABS: 650.0000 mg | ORAL_TABLET | Freq: Four times a day (QID) | ORAL | Status: DC | PRN

## 2024-06-15 MED ORDER — ONDANSETRON HCL 4 MG/2ML IJ SOLN: 4.0000 mg | Freq: Four times a day (QID) | INTRAMUSCULAR | Status: DC | PRN

## 2024-06-15 NOTE — Progress Notes (Signed)
 [facility] Cancer Follow up:    Katelyn Lombard, MD [address] [place] [address]   DIAGNOSIS: Cancer Staging  Malignant neoplasm of overlapping sites of left breast in female, estrogen receptor positive (HCC) Staging form: Breast, AJCC 8th Edition - Clinical stage from 12/11/2023: Stage IIB (cT2, cN1, cM0, G3, ER+, PR+, HER2-) - Signed by Odean Potts, MD on 12/11/2023 Stage prefix: Initial diagnosis Histologic grading system: 3 grade system  I connected with Levorn Snowman on 06/15/24 at  9:00 AM EDT by telephone and verified that I am speaking with the correct person using two identifiers.  I discussed the limitations, risks, security and privacy concerns of performing an evaluation and management service by telephone and the availability of in person appointments.  I also discussed with the patient that there may be a patient responsible charge related to this service. The patient expressed understanding and agreed to proceed.  Patient location: home Provider location: [facility] office  SUMMARY OF ONCOLOGIC HISTORY: Oncology History  Malignant neoplasm of overlapping sites of left breast in female, estrogen receptor positive (HCC)  12/05/2023 Initial Diagnosis   Patient presented with 2 palpable lumps in the left breast 10:00: 2.7 cm with skin tethering, 2:00: 4.2 cm, 3 masses in the left axilla possibly lymph nodes.  Biopsy of the first mass was IDC grade 2, ER 95%, PR 70%, Ki67 20%, HER2 negative; biopsy of second mass grade 3 IDC ER 70%, PR 60%, Ki67 90%, HER2 negative, biopsy of the lymph node grade 3 IDC   12/11/2023 Cancer Staging   Staging form: Breast, AJCC 8th Edition - Clinical stage from 12/11/2023: Stage IIB (cT2, cN1, cM0, G3, ER+, PR+, HER2-) - Signed by Odean Potts, MD on 12/11/2023 Stage prefix: Initial diagnosis Histologic grading system: 3 grade system   01/02/2024 Genetic Testing   Negative [facility] CancerNext +RNAinsight Panel.  Report  date is 01/02/2024.  The [facility] CancerNext+RNAinsight Panel includes sequencing, rearrangement analysis, and RNA analysis for the following 39 genes: APC, ATM, BAP1, BARD1, BMPR1A, BRCA1, BRCA2, BRIP1, CDH1, CDKN2A, CHEK2, FH, FLCN, MET, MLH1, MSH2, MSH6, MUTYH, NF1, NTHL1, PALB2, PMS2, PTEN, RAD51C, RAD51D, SMAD4, STK11, TP53, TSC1, TSC2, and VHL (sequencing and deletion/duplication); AXIN2, HOXB13, MBD4, MSH3, POLD1 and POLE (sequencing only); EPCAM and GREM1 (deletion/duplication only).   12/2023 -  Anti-estrogen oral therapy   Anastrozole  daily   01/20/2024 PET scan   PET/CT: Hypermetabolic left breast cancer, soft tissue nodule abutting left pectoralis major is smaller, several hypermetabolic nodes left axilla and supraclavicular are slightly smaller, left upper mediastinal lymph node, small lung nodules some appear to be smaller      CURRENT THERAPY: Anastrozole   INTERVAL HISTORY:  Discussed the use of AI scribe software for clinical note transcription with the patient, who gave verbal consent to proceed.  History of Present Illness Katelyn Fitzgerald is an 88 year old female with questionably metastatic invasive ductal carcinoma who presents with black stools. She is accompanied by her daughter, Harland.  She was diagnosed with questionably metastatic invasive ductal carcinoma in March 2025. Imaging on July 23rd showed left axillary adenopathy, a left breast mass, and small pulmonary nodules that were questionably malignant.  At her last reported left-sided pain affecting her back and legs, worsening over time and impacting sleep. The pain persisted despite discontinuing anastrozole  and using Tylenol . She occasionally feels pain under her arm and breast, sometimes requiring support to walk.  She takes Aleve and Tylenol  for pain and has resumed anastrozole , which  she takes at night.Since Saturday, she has had black stools. She denies lightheadedness, reflux, or indigestion but feels tired and  missed church this Sunday which was unusual for her.     Patient Active Problem List   Diagnosis Date Noted   Essential hypertension 06/01/2024   Osteoarthritis 06/01/2024   Osteoporosis 06/01/2024   Primary osteoarthritis involving multiple joints 06/01/2024   Stage 3a chronic kidney disease (HCC) 06/01/2024   Genetic testing 01/10/2024   Rheumatoid arthritis involving multiple sites (HCC) 12/11/2023   Methotrexate, long term, current use 12/11/2023   Malignant neoplasm of overlapping sites of left breast in female, estrogen receptor positive (HCC) 12/10/2023    has no known allergies.  MEDICAL HISTORY: Past Medical History:  Diagnosis Date   Arthritis    Breast cancer (HCC)    Hypertension    Osteoporosis     SURGICAL HISTORY: Past Surgical History:  Procedure Laterality Date   BREAST BIOPSY Left 12/05/2023   US  LT BREAST BX W LOC DEV EA ADD LESION IMG BX SPEC US  GUIDE 12/05/2023 [facility]   BREAST BIOPSY Left 12/05/2023   US  LT BREAST BX W LOC DEV 1ST LESION IMG BX SPEC US  GUIDE 12/05/2023 [facility]    SOCIAL HISTORY: Social History   Socioeconomic History   Marital status: Married    Spouse name: Not on file   Number of children: Not on file   Years of education: Not on file   Highest education level: Not on file  Occupational History   Not on file  Tobacco Use   Smoking status: Never   Smokeless tobacco: Never  Substance and Sexual Activity   Alcohol use: No   Drug use: Never   Sexual activity: Not on file  Other Topics Concern   Not on file  Social History Narrative   Not on file   Social Drivers of Health   Financial Resource Strain: Not on file  Food Insecurity: No Food Insecurity (12/11/2023)   Hunger Vital Sign    Worried About Running Out of Food in the Last Year: Never true    Ran Out of Food in the Last Year: Never true  Transportation Needs: No Transportation Needs (12/11/2023)   PRAPARE - Scientist, research (physical sciences) (Medical): No    Lack of Transportation (Non-Medical): No  Physical Activity: Not on file  Stress: Not on file  Social Connections: Unknown (12/11/2023)   Social Connection and Isolation Panel    Frequency of Communication with Friends and Family: Never    Frequency of Social Gatherings with Friends and Family: Never    Attends Religious Services: Never    Database administrator or Organizations: No    Attends Banker Meetings: Never    Marital Status: Not on file  Intimate Partner Violence: Not At Risk (12/11/2023)   Humiliation, Afraid, Rape, and Kick questionnaire    Fear of Current or Ex-Partner: No    Emotionally Abused: No    Physically Abused: No    Sexually Abused: No    FAMILY HISTORY: Family History  Problem Relation Age of Onset   Ovarian cancer Cousin        dx and d. 76s; maternal female cousin   Cancer Cousin        unknown type; maternal female cousin    Review of Systems  Constitutional:  Positive for fatigue. Negative for appetite change, chills, fever and unexpected weight change.  HENT:   Negative for  hearing loss, lump/mass and trouble swallowing.   Eyes:  Negative for eye problems and icterus.  Respiratory:  Negative for chest tightness, cough and shortness of breath.   Cardiovascular:  Negative for chest pain, leg swelling and palpitations.  Gastrointestinal:  Negative for abdominal distention, abdominal pain, constipation, diarrhea, nausea and vomiting.  Endocrine: Negative for hot flashes.  Genitourinary:  Negative for difficulty urinating.   Musculoskeletal:  Negative for arthralgias.  Skin:  Negative for itching and rash.  Neurological:  Negative for dizziness, extremity weakness, headaches and numbness.  Hematological:  Negative for adenopathy. Does not bruise/bleed easily.  Psychiatric/Behavioral:  Negative for depression. The patient is not nervous/anxious.       PHYSICAL EXAMINATION  Patient sounds well, in no  apparent distress, mood and behavior are normal, speech is normal, breathing is non labored     ASSESSMENT and THERAPY PLAN:   Assessment and Plan Assessment & Plan Melena, possible gastrointestinal bleeding New melena suggests possible gastrointestinal bleeding, likely ulcer secondary to Aleve use. No significant lightheadedness or fatigue. No reflux or indigestion. - Order CBC today. - Provide stool cards . - Instructed to stop Aleve immediately - Continue Tylenol  for pain.  Left back and leg pain including left trochanteric bursitis Chronic pain managed with Aleve and Tylenol . Aleve discontinued due to gastrointestinal risks. - Continue Tylenol . - Discontinue Aleve. - Imaging consistent with degeneration  History of malignant neoplasm of left breast Invasive ductal carcinoma.  On anastrozole .  Mammogram and ultrasound on 06/05/2024 show decrease in size of cancer in the breast.  She is recommended to continue anastrozole .     The patient was provided an opportunity to ask questions and all were answered. The patient agreed with the plan and demonstrated an understanding of the instructions.   The patient was advised to call back or seek an in-person evaluation if the symptoms worsen or if the condition fails to improve as anticipated.   I provided 10 minutes of non face-to-face telephone visit time during this encounter, and > 50% was spent counseling as documented under my assessment & plan.    Morna Kendall, NP 06/15/24 9:17 AM Medical Oncology and Hematology [facility] [address] [place], [place] [address] Tel. [phone]    Fax. [phone]  *Total Encounter Time as defined by the Centers for Medicare and Medicaid Services includes, in addition to the face-to-face time of a patient visit (documented in the note above) non-face-to-face time: obtaining and reviewing outside history, ordering and reviewing medications, tests or procedures, care  coordination (communications with other health care professionals or caregivers) and documentation in the medical record.

## 2024-06-15 NOTE — Progress Notes (Signed)
 Dr Celinda notified of HTN with SBP in 180's. IVF were not yet started, so RN held them per protocol. Dr Celinda ordered a repeat H&H to determine if the ordered unit of blood was still needed since patient is asymptomatic. Labetalol  was also ordered by MD. RN will closely monitor patient for changes.

## 2024-06-15 NOTE — H&P (View-Only) (Signed)
 Reason for Consult: Melena Referring Physician: Dorn Castor, MD   Katelyn Fitzgerald is 88 year old female with history of RA on NSAIDs and methotrexate, CKD IIIa, and prior breast cancer, presenting with melena and symptomatic anemia.  History of Present Illness : The patient reports the onset of melena beginning on 06/12/24 without any associated bright red blood per rectum. She denies having any hematemesis, nausea, vomiting or abdominal pain. She does report generalized weakness and dizziness. Last couple of weeks she has been taking Advil and Tylenol  twice a day for joint pains. Upon arrival to the emergency department, vital signs were stable except for tachycardia (heart rate of 109 bpm). Her hemoglobin was noted to have decreased to 7.8 g/dL from a baseline of 87.5 g/dL The patient has been taking naproxen and methotrexate for her rheumatoid arthritis and denies the use of any anticoagulant or antiplatelet medications.       Past Medical History:  Diagnosis Date   Arthritis     Breast cancer (HCC)     Hypertension     Osteoporosis           Past Surgical History:  Procedure Laterality Date   BREAST BIOPSY Left 12/05/2023    US  LT BREAST BX W LOC DEV EA ADD LESION IMG BX SPEC US  GUIDE 12/05/2023 [facility]   BREAST BIOPSY Left 12/05/2023    US  LT BREAST BX W LOC DEV 1ST LESION IMG BX SPEC US  GUIDE 12/05/2023 [facility]         Family History  Problem Relation Age of Onset   Ovarian cancer Cousin          dx and d. 89s; maternal female cousin   Cancer Cousin          unknown type; maternal female cousin     Social History:  reports that she has never smoked. She has never used smokeless tobacco. She reports that she does not drink alcohol and does not use drugs.   Allergies: No Known Allergies   Medications: I have reviewed the patient's current medications.        Results for orders placed or performed during the hospital encounter of 06/15/24 (from the past 48  hours)  POC occult blood, ED     Status: Abnormal    Collection Time: 06/15/24  1:22 PM  Result Value Ref Range    Fecal Occult Bld POSITIVE (A) NEGATIVE    No results found.  Review of Systems Blood pressure (!) 164/56, pulse (!) 109, temperature 98.1 F (36.7 C), temperature source Oral, resp. rate 18, SpO2 99%. General: Alert, pale looking, no acute distress.  HEENT: Soft, supple Cardiac: Regular rate and rhythm Lungs: Clear to auscultation bilaterally in all fields. Normal respiratory effort. Abdomen: Non-distended, soft, non-tender Extremities: No edema, 1 capillary filling Neurological: Alert and oriented 4. No focal deficits. Skin: No rashes noted.    #Melena rule out PUD/AVM's Patient with a past medical history of rheumatoid arthritis on lifelong methotrexate therapy and breast cancer, currently taking naproxen twice daily, presented with melena, generalized weakness, and dizziness. Given the prolonged use of NSAIDs and methotrexate, there is a high suspicion for an upper gastrointestinal ulcer. An esophagogastroduodenoscopy (EGD) is scheduled for tomorrow for further evaluation.  Plan: - EGD tomorrow at 1:00 PM at [facility]. - Liquid diet today - NPO 4 hours prior to EGD - Continue proton pump inhibitor (PPI) as ordered - Discontinue NSAID (naproxen) immediately  #Rheumatoid arthritis Well-controlled, patient taking methotrexate and  acetaminophen . -Avoid using ALL NSAIDS like Naproxen, Aleve -Tylenol  650  #Essential hypertension Stable.  Continue on home medication  #CKD stage IIIA Stable.  Scr:0.9 with baseline creatinine 0.9 and GFR 57. -Avoid prescribing any NSAID  #Breast cancer Stable. -Continue medication plan: Anastrozole         Armando Rossetti, MD 06/15/2024, 1:42 PM

## 2024-06-15 NOTE — ED Triage Notes (Signed)
 Black stools since Saturday morning, pt denies any pain. Pt had blood drawn today at [facility] and Hgb was 7.8. Pt is feeling a little weak and some lightheadedness recently.

## 2024-06-15 NOTE — Telephone Encounter (Signed)
 Charge at [facility] ED advised

## 2024-06-15 NOTE — Telephone Encounter (Signed)
 Spoke with pt's daughter Harland and advised her of Jaydan's Hgb of 7.8 and recommendation to go to the ED for evaluation of a possible GI bleed. Daughter agreed to this plan of care and is taking her to [facility]

## 2024-06-15 NOTE — Consult Note (Addendum)
 Reason for Consult: Melena Referring Physician: Dorn Castor, MD   Katelyn Fitzgerald is 88 year old female with history of RA on NSAIDs and methotrexate, CKD IIIa, and prior breast cancer, presenting with melena and symptomatic anemia.  History of Present Illness : The patient reports the onset of melena beginning on 06/12/24 without any associated bright red blood per rectum. She denies having any hematemesis, nausea, vomiting or abdominal pain. She does report generalized weakness and dizziness. Last couple of weeks she has been taking Advil and Tylenol  twice a day for joint pains. Upon arrival to the emergency department, vital signs were stable except for tachycardia (heart rate of 109 bpm). Her hemoglobin was noted to have decreased to 7.8 g/dL from a baseline of 87.5 g/dL The patient has been taking naproxen and methotrexate for her rheumatoid arthritis and denies the use of any anticoagulant or antiplatelet medications.       Past Medical History:  Diagnosis Date   Arthritis     Breast cancer (HCC)     Hypertension     Osteoporosis           Past Surgical History:  Procedure Laterality Date   BREAST BIOPSY Left 12/05/2023    US  LT BREAST BX W LOC DEV EA ADD LESION IMG BX SPEC US  GUIDE 12/05/2023 [facility]   BREAST BIOPSY Left 12/05/2023    US  LT BREAST BX W LOC DEV 1ST LESION IMG BX SPEC US  GUIDE 12/05/2023 [facility]         Family History  Problem Relation Age of Onset   Ovarian cancer Cousin          dx and d. 89s; maternal female cousin   Cancer Cousin          unknown type; maternal female cousin     Social History:  reports that she has never smoked. She has never used smokeless tobacco. She reports that she does not drink alcohol and does not use drugs.   Allergies: No Known Allergies   Medications: I have reviewed the patient's current medications.        Results for orders placed or performed during the hospital encounter of 06/15/24 (from the past 48  hours)  POC occult blood, ED     Status: Abnormal    Collection Time: 06/15/24  1:22 PM  Result Value Ref Range    Fecal Occult Bld POSITIVE (A) NEGATIVE    No results found.  Review of Systems Blood pressure (!) 164/56, pulse (!) 109, temperature 98.1 F (36.7 C), temperature source Oral, resp. rate 18, SpO2 99%. General: Alert, pale looking, no acute distress.  HEENT: Soft, supple Cardiac: Regular rate and rhythm Lungs: Clear to auscultation bilaterally in all fields. Normal respiratory effort. Abdomen: Non-distended, soft, non-tender Extremities: No edema, 1 capillary filling Neurological: Alert and oriented 4. No focal deficits. Skin: No rashes noted.    #Melena rule out PUD/AVM's Patient with a past medical history of rheumatoid arthritis on lifelong methotrexate therapy and breast cancer, currently taking naproxen twice daily, presented with melena, generalized weakness, and dizziness. Given the prolonged use of NSAIDs and methotrexate, there is a high suspicion for an upper gastrointestinal ulcer. An esophagogastroduodenoscopy (EGD) is scheduled for tomorrow for further evaluation.  Plan: - EGD tomorrow at 1:00 PM at [facility]. - Liquid diet today - NPO 4 hours prior to EGD - Continue proton pump inhibitor (PPI) as ordered - Discontinue NSAID (naproxen) immediately  #Rheumatoid arthritis Well-controlled, patient taking methotrexate and  acetaminophen . -Avoid using ALL NSAIDS like Naproxen, Aleve -Tylenol  650  #Essential hypertension Stable.  Continue on home medication  #CKD stage IIIA Stable.  Scr:0.9 with baseline creatinine 0.9 and GFR 57. -Avoid prescribing any NSAID  #Breast cancer Stable. -Continue medication plan: Anastrozole         Armando Rossetti, MD 06/15/2024, 1:42 PM

## 2024-06-15 NOTE — H&P (Signed)
 History and Physical    Patient: Katelyn Fitzgerald FMW:[id number] DOB: [birth year] DOA: 06/15/2024 DOS: the patient was seen and examined on 06/15/2024 PCP: Verdia Lombard, MD  Patient coming from: Home  Chief Complaint:  Chief Complaint  Patient presents with   Rectal Bleeding   HPI: Katelyn Fitzgerald is a 88 y.o. female with medical history significant of osteoarthritis, rheumatoid arthritis involving multiple sites, stage IIIa CKD, lumbago with sciatic, breast cancer, hypertension, osteoporosis who presented to the emergency department complaints of multiple episodes of melena since Saturday morning associated with generalized weakness and dizziness.  [name] hemoglobin has decreased from 12.4 in late July to 7.8 g/dL today.  She has been using OTC naproxen frequently in the past few days.   No abdominal pain, nausea, emesis, diarrhea, constipation or hematochezia.She denied fever, chills, rhinorrhea, sore throat, wheezing or hemoptysis.  No chest pain, palpitations, diaphoresis, PND, orthopnea or pitting edema of the lower extremities.   No flank pain, dysuria, frequency or hematuria.  No polyuria, polydipsia, polyphagia or blurred vision.   Lab work: Fecal occult blood was positive.  CBC showed white count of 7.8, hemoglobin 7.8 g/dL platelets 745.  CMP showed an alkaline phosphatase of 23 units/L, glucose 136 and BUN 31 mg/dL, the rest of the CMP measurements were normal.  ED course: Initial vital signs were temperature 98.1 F, pulse 109, respiration 18, BP 164/56 mmHg and O2 sat 99% on room air.  The patient was ordered a transfusion 1 unit of PRBC.   Review of Systems: As mentioned in the history of present illness. All other systems reviewed and are negative. Past Medical History:  Diagnosis Date   Arthritis    Breast cancer (HCC)    Hypertension    Osteoporosis    Past Surgical History:  Procedure Laterality Date   BREAST BIOPSY Left 12/05/2023   US  LT BREAST BX W LOC DEV EA ADD  LESION IMG BX SPEC US  GUIDE 12/05/2023 [facility]   BREAST BIOPSY Left 12/05/2023   US  LT BREAST BX W LOC DEV 1ST LESION IMG BX SPEC US  GUIDE 12/05/2023 [facility]   Social History:  reports that she has never smoked. She has never used smokeless tobacco. She reports that she does not drink alcohol and does not use drugs.  No Known Allergies  Family History  Problem Relation Age of Onset   Ovarian cancer Cousin        dx and d. 59s; maternal female cousin   Cancer Cousin        unknown type; maternal female cousin    Prior to Admission medications   Medication Sig Start Date End Date Taking? Authorizing Provider  anastrozole  (ARIMIDEX ) 1 MG tablet Take 1 tablet (1 mg total) by mouth daily. 12/11/23   Gudena, Vinay, MD  Calcium Carbonate-Vit D-Min (CALCIUM 1200 PO) Take 1 tablet by mouth 2 (two) times daily.    [provider]  cholecalciferol (VITAMIN D) 1000 units tablet Take 1,000 Units by mouth daily.    [provider]  folic acid  (FOLVITE ) 1 MG tablet Take 1 tablet (1 mg total) by mouth daily. 01/08/24   Gudena, Vinay, MD  lisinopril  (PRINIVIL ,ZESTRIL ) 10 MG tablet Take 10 mg by mouth daily.    [provider]  methotrexate (RHEUMATREX) 2.5 MG tablet Take 2.5 mg by mouth once a week. Caution:Chemotherapy. Protect from light.    [provider]  Multiple Vitamin (MULTIVITAMIN WITH MINERALS) TABS tablet Take 1 tablet by mouth daily.  [provider]  Multiple Vitamins-Minerals (PRESERVISION AREDS 2+MULTI VIT) CAPS Take 1 capsule by mouth daily.    [provider]  omega-3 acid ethyl esters (LOVAZA) 1 g capsule Take 1 g by mouth daily.    [provider]    Physical Exam: Vitals:   06/15/24 1237  BP: (!) 164/56  Pulse: (!) 109  Resp: 18  Temp: 98.1 F (36.7 C)  TempSrc: Oral  SpO2: 99%   Physical Exam Vitals reviewed.  Constitutional:      General: She is awake. She is not in acute distress.     Appearance: She is ill-appearing.  HENT:     Head: Normocephalic.     Nose: No rhinorrhea.     Mouth/Throat:     Mouth: Mucous membranes are moist.  Eyes:     General: No scleral icterus.    Pupils: Pupils are equal, round, and reactive to light.  Neck:     Vascular: No JVD.  Cardiovascular:     Rate and Rhythm: Normal rate and regular rhythm.     Heart sounds: S1 normal and S2 normal.  Pulmonary:     Effort: Pulmonary effort is normal.     Breath sounds: Normal breath sounds. No wheezing, rhonchi or rales.  Abdominal:     General: Bowel sounds are normal. There is no distension.     Palpations: Abdomen is soft.     Tenderness: There is no abdominal tenderness. There is no right CVA tenderness or left CVA tenderness.  Musculoskeletal:     Cervical back: Neck supple.     Right lower leg: No edema.     Left lower leg: No edema.  Skin:    General: Skin is warm and dry.     Coloration: Skin is pale.  Neurological:     General: No focal deficit present.     Mental Status: She is alert and oriented to person, place, and time.  Psychiatric:        Mood and Affect: Mood normal.        Behavior: Behavior normal. Behavior is cooperative.     Data Reviewed:  Results are pending, will review when available.  Assessment and Plan: Principal Problem:   ABLA (acute blood loss anemia) In the setting of:   UGI bleed Admit to stepdown/inpatient. Clear liquid diet.  Keep NPO after midnight. Continue KVO fluids. Continue pantoprazole  40 mg IVP. twice daily. Monitor H&H. Transfuse as needed. GI consult appreciated. Will need an EGD.  Active Problems:   Essential hypertension Continue lisinopril  10 mg p.o. daily. Hydralazine  10 mg IVP every 4 hours as needed.    Stage 3a chronic kidney disease (HCC) Monitor renal function and electrolytes.    Malignant neoplasm of overlapping sites of  left breast in female, estrogen receptor positive [facility]) Follow-up with oncology as an  outpatient as scheduled.     Advance Care Planning:   Code Status: Full Code   Consults: Gastroenterology STEWARD Sous, MD).  Family Communication: Her daughter was at bedside.  Severity of Illness: The appropriate patient status for this patient is INPATIENT. Inpatient status is judged to be reasonable and necessary in order to provide the required intensity of service to ensure the patient's safety. [name] presenting symptoms, physical exam findings, and initial radiographic and laboratory data in the context of their chronic comorbidities is felt to place them at high risk for further clinical deterioration. Furthermore, it is not anticipated that the patient will be  medically stable for discharge from the hospital within 2 midnights of admission.   * I certify that at the point of admission it is my clinical judgment that the patient will require inpatient hospital care spanning beyond 2 midnights from the point of admission due to high intensity of service, high risk for further deterioration and high frequency of surveillance required.*  Author: Alm Dorn Castor, MD 06/15/2024 1:35 PM  For on call review [email or web address].   This document was prepared using Dragon voice recognition software and may contain some unintended transcription errors.

## 2024-06-15 NOTE — ED Provider Notes (Addendum)
 [place] EMERGENCY DEPARTMENT AT [facility] Provider Note   CSN: [id number] Arrival date & time: 06/15/24  1224     Patient presents with: Rectal Bleeding   Katelyn Fitzgerald is a 88 y.o. female.   88 year old female who presents with rectal bleeding times several days.  Has had increased use of NSAIDs.  Denies any hematemesis.  States that she has had increasing dark bloody stools characterizes melena.  Denies any frank blood.  No fever or abdominal pain with this.  No prior history of GI hemorrhage upper or lower.  Went to the cancer center today and had blood work done and hemoglobin was 7.8.  Sent here for further evaluation       Prior to Admission medications   Medication Sig Start Date End Date Taking? Authorizing Provider  anastrozole  (ARIMIDEX ) 1 MG tablet Take 1 tablet (1 mg total) by mouth daily. 12/11/23   Gudena, Vinay, MD  Calcium Carbonate-Vit D-Min (CALCIUM 1200 PO) Take 1 tablet by mouth 2 (two) times daily.    [provider]  cholecalciferol (VITAMIN D) 1000 units tablet Take 1,000 Units by mouth daily.    [provider]  folic acid  (FOLVITE ) 1 MG tablet Take 1 tablet (1 mg total) by mouth daily. 01/08/24   Gudena, Vinay, MD  lisinopril  (PRINIVIL ,ZESTRIL ) 10 MG tablet Take 10 mg by mouth daily.    [provider]  methotrexate (RHEUMATREX) 2.5 MG tablet Take 2.5 mg by mouth once a week. Caution:Chemotherapy. Protect from light.    [provider]  Multiple Vitamin (MULTIVITAMIN WITH MINERALS) TABS tablet Take 1 tablet by mouth daily.    [provider]  Multiple Vitamins-Minerals (PRESERVISION AREDS 2+MULTI VIT) CAPS Take 1 capsule by mouth daily.    [provider]  omega-3 acid ethyl esters (LOVAZA) 1 g capsule Take 1 g by mouth daily.    [provider]    Allergies: Patient has no known allergies.    Review of Systems  All other systems reviewed and are negative.   Updated Vital Signs BP  (!) 164/56 (BP Location: Left Arm)   Pulse (!) 109   Temp 98.1 F (36.7 C) (Oral)   Resp 18   SpO2 99%   Physical Exam Vitals and nursing note reviewed. Exam conducted with a chaperone present.  Constitutional:      General: She is not in acute distress.    Appearance: Normal appearance. She is well-developed. She is not toxic-appearing.  HENT:     Head: Normocephalic and atraumatic.  Eyes:     General: Lids are normal.     Conjunctiva/sclera: Conjunctivae normal.     Pupils: Pupils are equal, round, and reactive to light.  Neck:     Thyroid : No thyroid  mass.     Trachea: No tracheal deviation.  Cardiovascular:     Rate and Rhythm: Normal rate and regular rhythm.     Heart sounds: Normal heart sounds. No murmur heard.    No gallop.  Pulmonary:     Effort: Pulmonary effort is normal. No respiratory distress.     Breath sounds: Normal breath sounds. No stridor. No decreased breath sounds, wheezing, rhonchi or rales.  Abdominal:     General: There is no distension.     Palpations: Abdomen is soft.     Tenderness: There is no abdominal tenderness. There is no rebound.  Genitourinary:    Comments: Dark stool noted on DRE Musculoskeletal:  General: No tenderness. Normal range of motion.     Cervical back: Normal range of motion and neck supple.  Skin:    General: Skin is warm and dry.     Findings: No abrasion or rash.  Neurological:     Mental Status: She is alert and oriented to person, place, and time. Mental status is at baseline.     GCS: GCS eye subscore is 4. GCS verbal subscore is 5. GCS motor subscore is 6.     Cranial Nerves: No cranial nerve deficit.     Sensory: No sensory deficit.     Motor: Motor function is intact.  Psychiatric:        Attention and Perception: Attention normal.        Speech: Speech normal.        Behavior: Behavior normal.     (all labs ordered are listed, but only abnormal results are displayed) Labs Reviewed  POC OCCULT BLOOD,  ED  POC OCCULT BLOOD, ED  TYPE AND SCREEN    EKG: None  Radiology: No results found.   Procedures   Medications Ordered in the ED  0.9 %  sodium chloride  infusion (has no administration in time range)                                    Medical Decision Making Amount and/or Complexity of Data Reviewed Labs: ordered.  Risk Prescription drug management. Decision regarding hospitalization.   Patient stool is guaiac positive.  Will send secure chat message to GI on-call.  Will consult hospitalist team for admission and order 1 unit of packed red blood cells as well as order Protonix .     Final diagnoses:  None    ED Discharge Orders     None          Dasie Faden, MD 06/15/24 1324    Dasie Faden, MD 06/15/24 1326    Dasie Faden, MD 06/15/24 1339

## 2024-06-15 NOTE — Plan of Care (Signed)
  Problem: Clinical Measurements: Goal: Ability to maintain clinical measurements within normal limits will improve Outcome: Progressing   Problem: Pain Managment: Goal: General experience of comfort will improve and/or be controlled Outcome: Progressing   Problem: Skin Integrity: Goal: Risk for impaired skin integrity will decrease Outcome: Progressing

## 2024-06-15 NOTE — Assessment & Plan Note (Signed)
 12/05/2023:Patient presented with 2 palpable lumps in the left breast 10:00: 2.7 cm with skin tethering, 2:00: 4.2 cm, 3 masses in the left axilla possibly lymph nodes.  Biopsy of the first mass was IDC grade 2, ER 95%, PR 70%, Ki67 20%, HER2 negative; biopsy of second mass grade 3 IDC ER 70%, PR 60%, Ki67 90%, HER2 negative, biopsy of the lymph node grade 3 IDC    12/20/2023: Breast MRI: Left breast: 5.3 cm mass UOQ abuts pectoralis muscle and skin, multiple satellite nodules and non-mass enhancement (entire area 8.8 cm), 3.7 cm irregular mass UIQ abuts pectoralis muscle and skin, 3 cm mass left axilla, mild diffuse skin thickening, 3 lymph nodes enlarged   12/25/2023: CT CAP: Left breast masses and left axillary and subpectoral lymph nodes, multiple small bilateral pulmonary nodules highly suspicious for lung metastases (0.8 cm, 0.8 cm, 1 cm)   01/07/2024: Bone scan: Negative   Treatment plan: Neoadjuvant antiestrogen therapy with anastrozole  Mastectomy with targeted node surgery Adjuvant radiation therapy Adjuvant antiestrogen therapy ----------------------------------------------------------------------------------------- 01/20/2024: PET/CT: Hypermetabolic left breast cancer, soft tissue nodule abutting left pectoralis major is smaller, several hypermetabolic nodes left axilla and supraclavicular are slightly smaller, left upper mediastinal lymph node, small lung nodules some appear to be smaller 01/07/2024: Bone scan: Normal 05/05/24: Stable Left breast mass. Soft tissue mass left axilla/ lateral margin of pectoralis (similar to before), sable left lower paratracheal LN.

## 2024-06-15 NOTE — Consult Note (Incomplete)
 Reason for Consult:*** Referring Physician: ***  Katelyn Fitzgerald is an 88 y.o. female.  HPI: ***  Past Medical History:  Diagnosis Date   Arthritis    Breast cancer (HCC)    Hypertension    Osteoporosis     Past Surgical History:  Procedure Laterality Date   BREAST BIOPSY Left 12/05/2023   US  LT BREAST BX W LOC DEV EA ADD LESION IMG BX SPEC US  GUIDE 12/05/2023 [facility]   BREAST BIOPSY Left 12/05/2023   US  LT BREAST BX W LOC DEV 1ST LESION IMG BX SPEC US  GUIDE 12/05/2023 [facility]    Family History  Problem Relation Age of Onset   Ovarian cancer Cousin        dx and d. 67s; maternal female cousin   Cancer Cousin        unknown type; maternal female cousin    Social History:  reports that she has never smoked. She has never used smokeless tobacco. She reports that she does not drink alcohol and does not use drugs.  Allergies: No Known Allergies  Medications: {medication reviewed/display:[id number]}  Results for orders placed or performed during the hospital encounter of 06/15/24 (from the past 48 hours)  POC occult blood, ED     Status: Abnormal   Collection Time: 06/15/24  1:22 PM  Result Value Ref Range   Fecal Occult Bld POSITIVE (A) NEGATIVE    No results found.  Review of Systems Blood pressure (!) 164/56, pulse (!) 109, temperature 98.1 F (36.7 C), temperature source Oral, resp. rate 18, SpO2 99%. Physical Exam  Assessment/Plan: ***  Katelyn Fitzgerald 06/15/2024, 1:42 PM

## 2024-06-15 NOTE — Consult Note (Addendum)
 Reason for Consult: Melena Referring Physician: Dorn Castor, MD  Katelyn Fitzgerald is 88 year old female with history of RA on NSAIDs and methotrexate, CKD IIIa, and prior breast cancer, presenting with melena and symptomatic anemia.  History of Present Illness : The patient reports the onset of melena beginning on, without any associated bright red blood per rectum. She denies hematemesis, nausea, vomiting, or abdominal pain. She does report generalized weakness and dizziness. Upon arrival to the emergency department, vital signs were stable except for tachycardia (heart rate of 109 bpm). Hemoglobin was noted to have decreased to 7.8 g/dL from a baseline of 87.5 g/dL The patient has been taking naproxen and methotrexate for her rheumatoid arthritis and denies the use of any anticoagulant or antiplatelet medications.  Past Medical History:  Diagnosis Date   Arthritis    Breast cancer (HCC)    Hypertension    Osteoporosis    Past Surgical History:  Procedure Laterality Date   BREAST BIOPSY Left 12/05/2023   US  LT BREAST BX W LOC DEV EA ADD LESION IMG BX SPEC US  GUIDE 12/05/2023 [facility]   BREAST BIOPSY Left 12/05/2023   US  LT BREAST BX W LOC DEV 1ST LESION IMG BX SPEC US  GUIDE 12/05/2023 [facility]   Family History  Problem Relation Age of Onset   Ovarian cancer Cousin        dx and d. 21s; maternal female cousin   Cancer Cousin        unknown type; maternal female cousin    Social History:  reports that she has never smoked. She has never used smokeless tobacco. She reports that she does not drink alcohol and does not use drugs.  Allergies: No Known Allergies  Medications: I have reviewed the patient's current medications.  Results for orders placed or performed during the hospital encounter of 06/15/24 (from the past 48 hours)  POC occult blood, ED     Status: Abnormal   Collection Time: 06/15/24  1:22 PM  Result Value Ref Range   Fecal Occult Bld POSITIVE (A)  NEGATIVE    No results found.  Review of Systems Blood pressure (!) 164/56, pulse (!) 109, temperature 98.1 F (36.7 C), temperature source Oral, resp. rate 18, SpO2 99%. General: Alert, pale looking, no acute distress.  HEENT: Soft, supple Cardiac: Regular rate and rhythm Lungs: Clear to auscultation bilaterally in all fields. Normal respiratory effort. Abdomen: Non-distended, soft, non-tender Extremities: No edema, 1 capillary filling Neurological: Alert and oriented 4. No focal deficits. Skin: No rashes noted.   #Melena #UpperGIBleeding #PepticUlcer Patient with a past medical history of rheumatoid arthritis on lifelong methotrexate therapy and breast cancer, currently taking naproxen twice daily, presented with melena, generalized weakness, and dizziness. Given the prolonged use of NSAIDs and methotrexate, there is a high suspicion for an upper gastrointestinal ulcer. An esophagogastroduodenoscopy (EGD) is scheduled for tomorrow for further evaluation.  Plan: - EGD tomorrow at 1:00 PM at [facility]. - Liquid diet today - NPO 4 hours prior to EGD - Continue proton pump inhibitor (PPI) as ordered - Discontinue NSAID (naproxen) immediately    Armando Rossetti, MD 06/15/2024, 1:42 PM

## 2024-06-16 ENCOUNTER — Encounter (HOSPITAL_COMMUNITY): Payer: Self-pay

## 2024-06-16 ENCOUNTER — Inpatient Hospital Stay (HOSPITAL_COMMUNITY): Payer: Medicare (Managed Care) | Admitting: Registered Nurse

## 2024-06-16 ENCOUNTER — Encounter (HOSPITAL_COMMUNITY)
Admission: EM | Disposition: A | Discharge status: Home / Self Care | Payer: Self-pay | Source: Ambulatory Visit | Attending: Internal Medicine

## 2024-06-16 DIAGNOSIS — I129 Hypertensive chronic kidney disease with stage 1 through stage 4 chronic kidney disease, or unspecified chronic kidney disease: Secondary | ICD-10-CM | POA: Diagnosis not present

## 2024-06-16 DIAGNOSIS — D62 Acute posthemorrhagic anemia: Secondary | ICD-10-CM | POA: Diagnosis not present

## 2024-06-16 DIAGNOSIS — K279 Peptic ulcer, site unspecified, unspecified as acute or chronic, without hemorrhage or perforation: Secondary | ICD-10-CM

## 2024-06-16 DIAGNOSIS — K922 Gastrointestinal hemorrhage, unspecified: Secondary | ICD-10-CM | POA: Diagnosis not present

## 2024-06-16 DIAGNOSIS — N1831 Chronic kidney disease, stage 3a: Secondary | ICD-10-CM

## 2024-06-16 HISTORY — PX: HEMOSTASIS CLIP PLACEMENT: SHX6857

## 2024-06-16 HISTORY — PX: BIOPSY OF SKIN SUBCUTANEOUS TISSUE AND/OR MUCOUS MEMBRANE: SHX6741

## 2024-06-16 HISTORY — PX: SCLEROTHERAPY: SHX6841

## 2024-06-16 HISTORY — PX: ESOPHAGOGASTRODUODENOSCOPY: SHX5428

## 2024-06-16 LAB — CBC
HCT: 22.8 % — ABNORMAL LOW (ref 36.0–46.0)
Hemoglobin: 7.3 g/dL — ABNORMAL LOW (ref 12.0–15.0)
MCH: 30.9 pg (ref 26.0–34.0)
MCHC: 32 g/dL (ref 30.0–36.0)
MCV: 96.6 fL (ref 80.0–100.0)
Platelets: 242 K/uL (ref 150–400)
RBC: 2.36 MIL/uL — ABNORMAL LOW (ref 3.87–5.11)
RDW: 15.3 % (ref 11.5–15.5)
WBC: 6.3 K/uL (ref 4.0–10.5)
nRBC: 0 % (ref 0.0–0.2)

## 2024-06-16 LAB — COMPREHENSIVE METABOLIC PANEL WITH GFR
ALT: 18 U/L (ref 0–44)
AST: 28 U/L (ref 15–41)
Albumin: 3.7 g/dL (ref 3.5–5.0)
Alkaline Phosphatase: 29 U/L — ABNORMAL LOW (ref 38–126)
Anion gap: 11 (ref 5–15)
BUN: 19 mg/dL (ref 8–23)
CO2: 22 mmol/L (ref 22–32)
Calcium: 8.8 mg/dL — ABNORMAL LOW (ref 8.9–10.3)
Chloride: 109 mmol/L (ref 98–111)
Creatinine, Ser: 0.94 mg/dL (ref 0.44–1.00)
GFR, Estimated: 59 mL/min — ABNORMAL LOW (ref >60)
Glucose, Bld: 99 mg/dL (ref 70–99)
Potassium: 4.1 mmol/L (ref 3.5–5.1)
Sodium: 141 mmol/L (ref 135–145)
Total Bilirubin: 0.3 mg/dL (ref 0.0–1.2)
Total Protein: 6 g/dL — ABNORMAL LOW (ref 6.5–8.1)

## 2024-06-16 LAB — HEMOGLOBIN AND HEMATOCRIT, BLOOD
HCT: 31.9 % — ABNORMAL LOW (ref 36.0–46.0)
Hemoglobin: 10.3 g/dL — ABNORMAL LOW (ref 12.0–15.0)

## 2024-06-16 LAB — PREPARE RBC (CROSSMATCH)

## 2024-06-16 SURGERY — EGD (ESOPHAGOGASTRODUODENOSCOPY)
Anesthesia: Monitor Anesthesia Care

## 2024-06-16 MED ORDER — PHENYLEPHRINE HCL (PRESSORS) 10 MG/ML IV SOLN
INTRAVENOUS | Status: DC | PRN
  Administered 2024-06-16 (×2): 80 ug via INTRAVENOUS
  Administered 2024-06-16: 160 ug via INTRAVENOUS

## 2024-06-16 MED ORDER — SUCRALFATE 1 G PO TABS
1.0000 g | ORAL_TABLET | Freq: Three times a day (TID) | ORAL | Status: DC
  Administered 2024-06-16 – 2024-06-18 (×7): 1 g via ORAL
  Filled 2024-06-16 (×7): qty 1

## 2024-06-16 MED ORDER — SODIUM CHLORIDE (PF) 0.9 % IJ SOLN
PREFILLED_SYRINGE | INTRAMUSCULAR | Status: DC | PRN
  Administered 2024-06-16: 1.5 mL

## 2024-06-16 MED ORDER — EPINEPHRINE 1 MG/10ML IJ SOSY
PREFILLED_SYRINGE | INTRAMUSCULAR | Status: AC
Start: 2024-06-16 — End: 2024-06-16
  Filled 2024-06-16: qty 10

## 2024-06-16 MED ORDER — SODIUM CHLORIDE 0.9 % IV SOLN
INTRAVENOUS | Status: DC
Start: 2024-06-16 — End: 2024-06-16

## 2024-06-16 MED ORDER — PROPOFOL 500 MG/50ML IV EMUL
INTRAVENOUS | Status: DC | PRN
  Administered 2024-06-16: 30 mg via INTRAVENOUS
  Administered 2024-06-16: 20 mg via INTRAVENOUS
  Administered 2024-06-16: 30 mg via INTRAVENOUS
  Administered 2024-06-16: 100 mg via INTRAVENOUS
  Administered 2024-06-16: 20 mg via INTRAVENOUS

## 2024-06-16 MED ORDER — SODIUM CHLORIDE 0.9% IV SOLUTION: Freq: Once | INTRAVENOUS | Status: AC

## 2024-06-16 MED ORDER — LIDOCAINE HCL (PF) 2 % IJ SOLN
INTRAMUSCULAR | Status: DC | PRN
  Administered 2024-06-16: 100 mg via INTRADERMAL

## 2024-06-16 NOTE — Op Note (Signed)
 [facility] Patient Name: Katelyn Fitzgerald Procedure Date: 06/16/2024 MRN: [id number] Attending MD: Belvie Just , MD, [id number] Date of Birth: [birth year] CSN: [id number] Age: 88 Admit Type: Outpatient Procedure:                Upper GI endoscopy Indications:              Acute post hemorrhagic anemia, Melena Providers:                Gregoria Pierce, RN, Felice Sar, Technician,                            Belvie Just, MD Referring MD:              Medicines:                Propofol  per Anesthesia Complications:            No immediate complications. Estimated Blood Loss:     Estimated blood loss: none. Procedure:                Pre-Anesthesia Assessment:                           - Prior to the procedure, a History and Physical                            was performed, and patient medications and                            allergies were reviewed. [name] tolerance of                            previous anesthesia was also reviewed. The risks                            and benefits of the procedure and the sedation                            options and risks were discussed with the patient.                            All questions were answered, and informed consent                            was obtained. Prior Anticoagulants: The patient has                            taken no anticoagulant or antiplatelet agents. ASA                            Grade Assessment: III - A patient with severe                            systemic disease. After reviewing the risks and  benefits, the patient was deemed in satisfactory                            condition to undergo the procedure.                           - Sedation was administered by an anesthesia                            professional. Deep sedation was attained.                           After obtaining informed consent, the endoscope was                            passed under direct  vision. Throughout the                            procedure, the patient's blood pressure, pulse, and                            oxygen saturations were monitored continuously. The                            [id number]-H190 ([id number]) Olympus endoscope was introduced                            through the mouth, and advanced to the second part                            of duodenum. The upper GI endoscopy was                            accomplished without difficulty. The patient                            tolerated the procedure well. Scope In: Scope Out: Findings:      The esophagus was normal.      Many non-bleeding superficial gastric ulcers with a clean ulcer base       (Forrest Class III) were found in the gastric antrum. The largest lesion       was 2 mm in largest dimension. Biopsies were taken with a cold forceps       for Helicobacter pylori testing.      Four non-bleeding cratered duodenal ulcers with a nonbleeding visible       vessel (Forrest Class IIa) and three clean-based ulcers (Forrest Class       III) were found in the duodenal bulb. The largest lesion was 15 mm in       largest dimension. Area was successfully injected with 1 mL of a 0.1       mg/mL solution of epinephrine  for drug delivery. Coagulation for tissue       destruction using bipolar probe was successful. For hemostasis, two       hemostatic clips were successfully placed (MR safe). Clip manufacturer:  [employer]. There was no bleeding at the end of the procedure.      In the antrum there were multiple healing ulcerations. In the distal       duodenal bulb there was a large 1.5 linearly cratered ulcer with a small       visible vessel on the proximal edge of the ulcer. The area was treated       wtih 1.5 ml of 1:10,000 epi. The mucosa appropriately blanched. BICAP       was applied for 5 seconds and the vessel was ablated. There was the       possibility of a secondary vessel in the ulcer bed and this  was also       treated with BICAP. No bleeding was precipitated. Two hemoclips were       deployed, but only the Mantis was successful in closing the proximal       portion of the ulcer. JJust inferior to this ulcer was another linearly       cratered ulcer that was a Forrest Class III. Two other proixmal duodenal       bulb ulcers were found and they were noted to be Forrest III ulcers. Impression:               - Normal esophagus.                           - Non-bleeding gastric ulcers with a clean ulcer                            base (Forrest Class III). Biopsied.                           - Non-bleeding duodenal ulcers with a nonbleeding                            visible vessel (Forrest Class IIa). Injected.                            Treated with bipolar cautery. Clip manufacturer:                            [employer]. Clips (MR safe) were placed. Moderate Sedation:      Not Applicable - Patient had care per Anesthesia. Recommendation:           - Return patient to hospital ward for ongoing care.                           - Clear liquid diet.                           - Continue present medications.                           - Await pathology results.                           - Monitor HGB and transfuse if necessary.                           -  Maintain pantoprazole .                           - Sucralfate  1 gram TID. Procedure Code(s):        --- Professional ---                           [phone], Esophagogastroduodenoscopy, flexible,                            transoral; with ablation of tumor(s), polyp(s), or                            other lesion(s) (includes pre- and post-dilation                            and guide wire passage, when performed)                           43255, 59, Esophagogastroduodenoscopy, flexible,                            transoral; with control of bleeding, any method                           43239, 59, Esophagogastroduodenoscopy, flexible,                             transoral; with biopsy, single or multiple                           43236, 59, Esophagogastroduodenoscopy, flexible,                            transoral; with directed submucosal injection(s),                            any substance Diagnosis Code(s):        --- Professional ---                           K26.4, Chronic or unspecified duodenal ulcer with                            hemorrhage                           K25.9, Gastric ulcer, unspecified as acute or                            chronic, without hemorrhage or perforation                           D62, Acute posthemorrhagic anemia                           K92.1, Melena (includes Hematochezia) CPT copyright 2022 American Medical Association. All rights reserved.  The codes documented in this report are preliminary and upon coder review may  be revised to meet current compliance requirements. Belvie Just, MD Belvie Just, MD 06/16/2024 1:49:42 PM This report has been signed electronically. Number of Addenda: 0

## 2024-06-16 NOTE — Anesthesia Preprocedure Evaluation (Addendum)
 Anesthesia Evaluation  Patient identified by MRN, date of birth, ID band Patient awake    Reviewed: Allergy & Precautions, NPO status , Patient's Chart, lab work & pertinent test results  History of Anesthesia Complications Negative for: history of anesthetic complications  Airway Mallampati: II  TM Distance: >3 FB Neck ROM: Full    Dental  (+) Edentulous Lower, Edentulous Upper   Pulmonary neg pulmonary ROS   Pulmonary exam normal        Cardiovascular hypertension, Pt. on medications Normal cardiovascular exam     Neuro/Psych    GI/Hepatic Neg liver ROS,,,melena   Endo/Other  negative endocrine ROS    Renal/GU Renal InsufficiencyRenal disease     Musculoskeletal  (+) Arthritis ,    Abdominal   Peds  Hematology  (+) Blood dyscrasia (Hgb 7.3), anemia   Anesthesia Other Findings   Reproductive/Obstetrics                              Anesthesia Physical Anesthesia Plan  ASA: 3  Anesthesia Plan: MAC   Post-op Pain Management: Minimal or no pain anticipated   Induction:   PONV Risk Score and Plan: 2 and Treatment may vary due to age or medical condition and Propofol  infusion  Airway Management Planned: Natural Airway and Nasal Cannula  Additional Equipment: None  Intra-op Plan:   Post-operative Plan:   Informed Consent: I have reviewed the patients History and Physical, chart, labs and discussed the procedure including the risks, benefits and alternatives for the proposed anesthesia with the patient or authorized representative who has indicated his/her understanding and acceptance.     Dental advisory given  Plan Discussed with: CRNA  Anesthesia Plan Comments:          Anesthesia Quick Evaluation

## 2024-06-16 NOTE — Plan of Care (Signed)

## 2024-06-16 NOTE — Plan of Care (Signed)
  Problem: Clinical Measurements: ?Goal: Ability to maintain clinical measurements within normal limits will improve ?Outcome: Progressing ?  ?Problem: Nutrition: ?Goal: Adequate nutrition will be maintained ?Outcome: Progressing ?  ?Problem: Skin Integrity: ?Goal: Risk for impaired skin integrity will decrease ?Outcome: Progressing ?  ?

## 2024-06-16 NOTE — Hospital Course (Signed)
 Katelyn Fitzgerald is a 88 y.o. female with medical history significant of osteoarthritis, rheumatoid arthritis involving multiple sites, stage IIIa CKD, lumbago with sciatic, breast cancer, hypertension, osteoporosis who presented to the emergency department complaints of multiple episodes of melena since Saturday morning associated with generalized weakness and dizziness.   [name] hemoglobin has decreased from 12.4 in late July to 7.8 g/dL on admission. She endorsed feeling increased rheumatoid pains prior to admission and was taking naproxen twice daily for approximately 2 weeks. FOBT was positive on admission. GI was consulted on admission.   Assessment and Plan:  UGIB - concern for PUD from recent naproxen use; felt was having worsening RA pains - Last hemoglobin 04/29/2024 noted at 12.4 g/dL.  On admission found to be 7.8 g/dL - Some further downtrend at 7.3 g/dL this morning, we will go ahead and transfuse 1 unit given being essentially symptomatic and large drop -Repeat H/H after transfusion - GI following with plans for EGD to further evaluate - Continue n.p.o. - Continue fluids - Continue Protonix   Essential hypertension Continue lisinopril    Stage 3a chronic kidney disease (HCC) - patient has history of CKD3a. Baseline creat ~ 0.9 - 1, eGFR~ 58  Malignant neoplasm of overlapping sites of left breast in female, estrogen receptor positive (HCC) Follow-up with oncology as an outpatient as scheduled. -On anastrozole  outpatient - On Prolia   Rheumatoid arthritis - On methotrexate

## 2024-06-16 NOTE — Interval H&P Note (Signed)
 History and Physical Interval Note:  06/16/2024 1:02 PM  Katelyn Fitzgerald  has presented today for surgery, with the diagnosis of MELENA WITH POST-HEMORRHAGIC ANEMIA.  The various methods of treatment have been discussed with the patient and family. After consideration of risks, benefits and other options for treatment, the patient has consented to  Procedure(s): EGD (ESOPHAGOGASTRODUODENOSCOPY) (N/A) as a surgical intervention.  [name] history has been reviewed, patient examined, no change in status, stable for surgery.  I have reviewed the patient's chart and labs.  Questions were answered to the patient's satisfaction.     Braleigh Massoud D

## 2024-06-16 NOTE — Anesthesia Postprocedure Evaluation (Signed)
 Anesthesia Post Note  Patient: Katelyn Fitzgerald  Procedure(s) Performed: EGD (ESOPHAGOGASTRODUODENOSCOPY) SCLEROTHERAPY CONTROL OF HEMORRHAGE, GI TRACT, ENDOSCOPIC, BY CLIPPING OR OVERSEWING BIOPSY, SKIN, SUBCUTANEOUS TISSUE, OR MUCOUS MEMBRANE     Patient location during evaluation: PACU Anesthesia Type: MAC Level of consciousness: awake and alert Pain management: pain level controlled Vital Signs Assessment: post-procedure vital signs reviewed and stable Respiratory status: spontaneous breathing, nonlabored ventilation and respiratory function stable Cardiovascular status: blood pressure returned to baseline Postop Assessment: no apparent nausea or vomiting Anesthetic complications: no   No notable events documented.  Last Vitals:  Vitals:   06/16/24 1500 06/16/24 1522  BP:  (!) 168/49  Pulse: 94 98  Resp: (!) 23 18  Temp:    SpO2: 100% 100%    Last Pain:  Vitals:   06/16/24 1424  TempSrc:   PainSc: 0-No pain                 Vertell Row

## 2024-06-16 NOTE — Transfer of Care (Signed)
 Immediate Anesthesia Transfer of Care Note  Patient: Katelyn Fitzgerald  Procedure(s) Performed: EGD (ESOPHAGOGASTRODUODENOSCOPY) SCLEROTHERAPY CONTROL OF HEMORRHAGE, GI TRACT, ENDOSCOPIC, BY CLIPPING OR OVERSEWING BIOPSY, SKIN, SUBCUTANEOUS TISSUE, OR MUCOUS MEMBRANE  Patient Location: PACU  Anesthesia Type:MAC  Level of Consciousness: drowsy  Airway & Oxygen Therapy: Patient Spontanous Breathing  Post-op Assessment: Report given to RN and Post -op Vital signs reviewed and stable  Post vital signs: Reviewed and stable  Last Vitals:  Vitals Value Taken Time  BP 118/39 06/16/24 13:37  Temp 36.3 C 06/16/24 13:37  Pulse 80 06/16/24 13:38  Resp 25 06/16/24 13:38  SpO2 100 % 06/16/24 13:38  Vitals shown include unfiled device data.  Last Pain:  Vitals:   06/16/24 1337  TempSrc: Temporal  PainSc: 0-No pain      Patients Stated Pain Goal: 0 (06/15/24 1930)  Complications: No notable events documented.

## 2024-06-16 NOTE — Progress Notes (Signed)
 Progress Note    Katelyn Fitzgerald   FMW:[id number]  DOB: 11-Sep-1936  DOA: 06/15/2024     1 PCP: Verdia Lombard, MD  Initial CC: dark stools  Hospital Course: Katelyn Fitzgerald is a 88 y.o. female with medical history significant of osteoarthritis, rheumatoid arthritis involving multiple sites, stage IIIa CKD, lumbago with sciatic, breast cancer, hypertension, osteoporosis who presented to the emergency department complaints of multiple episodes of melena since Saturday morning associated with generalized weakness and dizziness.   [name] hemoglobin has decreased from 12.4 in late July to 7.8 g/dL on admission. She endorsed feeling increased rheumatoid pains prior to admission and was taking naproxen twice daily for approximately 2 weeks. FOBT was positive on admission. GI was consulted on admission.   Assessment and Plan:  UGIB - concern for PUD from recent naproxen use; felt was having worsening RA pains - Last hemoglobin 04/29/2024 noted at 12.4 g/dL.  On admission found to be 7.8 g/dL - Some further downtrend at 7.3 g/dL this morning, we will go ahead and transfuse 1 unit given being essentially symptomatic and large drop -Repeat H/H after transfusion - GI following with plans for EGD to further evaluate - Continue n.p.o. - Continue fluids - Continue Protonix   Essential hypertension Continue lisinopril    Stage 3a chronic kidney disease (HCC) - patient has history of CKD3a. Baseline creat ~ 0.9 - 1, eGFR~ 58  Malignant neoplasm of overlapping sites of left breast in female, estrogen receptor positive (HCC) Follow-up with oncology as an outpatient as scheduled. -On anastrozole  outpatient - On Prolia   Rheumatoid arthritis - On methotrexate  Interval History:  No events overnight.  Resting in bed comfortably with daughter present bedside.  Still ongoing dark stools. Undergoing EGD today.  Old records reviewed in assessment of this patient  Antimicrobials:   DVT  prophylaxis:  SCDs Start: 06/15/24 1349   Code Status:   Code Status: Full Code  Mobility Assessment (Last 72 Hours)     Mobility Assessment     Row Name 06/16/24 0800 06/15/24 2000 06/15/24 1700       Does the patient have exclusion criteria? No - Perform mobility assessment No - Perform mobility assessment No - Perform mobility assessment     What is the highest level of mobility based on the mobility assessment? Level 5 (Ambulates independently) - Balance while walking independently - Complete Level 5 (Ambulates independently) - Balance while walking independently - Complete Level 5 (Ambulates independently) - Balance while walking independently - Complete        Barriers to discharge: None Disposition Plan: Home HH orders placed: N/A Status is: Inpatient  Objective: Blood pressure (!) 183/59, pulse 94, temperature (!) 97 F (36.1 C), temperature source Temporal, resp. rate (!) 21, height 5' 1 (1.549 m), weight 56 kg, SpO2 100%.  Examination:  Physical Exam Constitutional:      Appearance: Normal appearance.  HENT:     Head: Normocephalic and atraumatic.     Mouth/Throat:     Mouth: Mucous membranes are moist.  Eyes:     Extraocular Movements: Extraocular movements intact.  Cardiovascular:     Rate and Rhythm: Normal rate and regular rhythm.  Pulmonary:     Effort: Pulmonary effort is normal. No respiratory distress.     Breath sounds: Normal breath sounds. No wheezing.  Abdominal:     General: Bowel sounds are normal. There is no distension.     Palpations: Abdomen is soft.     Tenderness: There is no  abdominal tenderness.  Musculoskeletal:        General: Normal range of motion.     Cervical back: Normal range of motion and neck supple.  Skin:    General: Skin is warm and dry.  Neurological:     General: No focal deficit present.     Mental Status: She is alert.  Psychiatric:        Mood and Affect: Mood normal.      Consultants:  GI  Procedures:   06/16/2024: Tentative EGD  Data Reviewed: Results for orders placed or performed during the hospital encounter of 06/15/24 (from the past 24 hours)  Type and screen [facility]     Status: None (Preliminary result)   Collection Time: 06/15/24  1:50 PM  Result Value Ref Range   ABO/RH(D) AB POS    Antibody Screen NEG    Sample Expiration 06/18/2024,2359    Unit Number [id number]    Blood Component Type RED CELLS,LR    Unit division 00    Status of Unit ISSUED    Transfusion Status OK TO TRANSFUSE    Crossmatch Result      Compatible Performed at [facility], [address], [place], [place] [address]   Prepare RBC (crossmatch)     Status: None   Collection Time: 06/15/24  1:58 PM  Result Value Ref Range   Order Confirmation      ORDER PROCESSED BY BLOOD BANK Performed at [facility], [address] [place], [place] [address]   MRSA Next Gen by PCR, Nasal     Status: None   Collection Time: 06/15/24  4:12 PM   Specimen: Nasal Mucosa; Nasal Swab  Result Value Ref Range   MRSA by PCR Next Gen NOT DETECTED NOT DETECTED  ABO/Rh     Status: None   Collection Time: 06/15/24  5:22 PM  Result Value Ref Range   ABO/RH(D)      AB POS Performed at [facility], [address] [place], [place] [address]   Hemoglobin and hematocrit, blood     Status: Abnormal   Collection Time: 06/15/24  5:22 PM  Result Value Ref Range   Hemoglobin 7.5 (L) 12.0 - 15.0 g/dL   HCT 76.6 (L) 63.9 - 53.9 %  Hemoglobin and hematocrit, blood     Status: Abnormal   Collection Time: 06/15/24  9:39 PM  Result Value Ref Range   Hemoglobin 7.3 (L) 12.0 - 15.0 g/dL   HCT 77.2 (L) 63.9 - 53.9 %  CBC     Status: Abnormal   Collection Time: 06/16/24  3:31 AM  Result Value Ref Range   WBC 6.3 4.0 - 10.5 K/uL   RBC 2.36 (L) 3.87 - 5.11 MIL/uL   Hemoglobin 7.3 (L) 12.0 - 15.0 g/dL   HCT 77.1 (L) 63.9 - 53.9 %   MCV 96.6 80.0 -  100.0 fL   MCH 30.9 26.0 - 34.0 pg   MCHC 32.0 30.0 - 36.0 g/dL   RDW 84.6 88.4 - 84.4 %   Platelets 242 150 - 400 K/uL   nRBC 0.0 0.0 - 0.2 %  Comprehensive metabolic panel     Status: Abnormal   Collection Time: 06/16/24  3:31 AM  Result Value Ref Range   Sodium 141 135 - 145 mmol/L   Potassium 4.1 3.5 - 5.1 mmol/L   Chloride 109 98 - 111 mmol/L   CO2 22 22 - 32 mmol/L  Glucose, Bld 99 70 - 99 mg/dL   BUN 19 8 - 23 mg/dL   Creatinine, Ser 9.05 0.44 - 1.00 mg/dL   Calcium 8.8 (L) 8.9 - 10.3 mg/dL   Total Protein 6.0 (L) 6.5 - 8.1 g/dL   Albumin 3.7 3.5 - 5.0 g/dL   AST 28 15 - 41 U/L   ALT 18 0 - 44 U/L   Alkaline Phosphatase 29 (L) 38 - 126 U/L   Total Bilirubin 0.3 0.0 - 1.2 mg/dL   GFR, Estimated 59 (L) >60 mL/min   Anion gap 11 5 - 15  Prepare RBC (crossmatch)     Status: None   Collection Time: 06/16/24  8:46 AM  Result Value Ref Range   Order Confirmation      BB SAMPLE OR UNITS ALREADY AVAILABLE Performed at [facility], [address], [place], [place] [address]     I have reviewed pertinent nursing notes, vitals, labs, and images as necessary. I have ordered labwork to follow up on as indicated.  I have reviewed the last notes from staff over past 24 hours. I have discussed patient's care plan and test results with nursing staff, CM/SW, and other staff as appropriate.  Time spent: Greater than 50% of the 55 minute visit was spent in counseling/coordination of care for the patient as laid out in the A&P.   LOS: 1 day   Alm Apo, MD [facility] 06/16/2024, 1:22 PM

## 2024-06-17 ENCOUNTER — Encounter (HOSPITAL_COMMUNITY): Payer: Self-pay | Admitting: Gastroenterology

## 2024-06-17 DIAGNOSIS — K922 Gastrointestinal hemorrhage, unspecified: Secondary | ICD-10-CM | POA: Diagnosis not present

## 2024-06-17 LAB — CBC WITH DIFFERENTIAL/PLATELET
Abs Immature Granulocytes: 0.06 K/uL (ref 0.00–0.07)
Basophils Absolute: 0.1 K/uL (ref 0.0–0.1)
Basophils Relative: 1 %
Eosinophils Absolute: 0.1 K/uL (ref 0.0–0.5)
Eosinophils Relative: 2 %
HCT: 29.8 % — ABNORMAL LOW (ref 36.0–46.0)
Hemoglobin: 9.8 g/dL — ABNORMAL LOW (ref 12.0–15.0)
Immature Granulocytes: 1 %
Lymphocytes Relative: 14 %
Lymphs Abs: 1 K/uL (ref 0.7–4.0)
MCH: 30.9 pg (ref 26.0–34.0)
MCHC: 32.9 g/dL (ref 30.0–36.0)
MCV: 94 fL (ref 80.0–100.0)
Monocytes Absolute: 0.6 K/uL (ref 0.1–1.0)
Monocytes Relative: 8 %
Neutro Abs: 5.7 K/uL (ref 1.7–7.7)
Neutrophils Relative %: 74 %
Platelets: 230 K/uL (ref 150–400)
RBC: 3.17 MIL/uL — ABNORMAL LOW (ref 3.87–5.11)
RDW: 15.5 % (ref 11.5–15.5)
WBC: 7.5 K/uL (ref 4.0–10.5)
nRBC: 0 % (ref 0.0–0.2)

## 2024-06-17 LAB — BASIC METABOLIC PANEL WITH GFR
Anion gap: 13 (ref 5–15)
BUN: 10 mg/dL (ref 8–23)
CO2: 18 mmol/L — ABNORMAL LOW (ref 22–32)
Calcium: 8.7 mg/dL — ABNORMAL LOW (ref 8.9–10.3)
Chloride: 109 mmol/L (ref 98–111)
Creatinine, Ser: 0.76 mg/dL (ref 0.44–1.00)
GFR, Estimated: >60 mL/min (ref >60)
Glucose, Bld: 89 mg/dL (ref 70–99)
Potassium: 4 mmol/L (ref 3.5–5.1)
Sodium: 140 mmol/L (ref 135–145)

## 2024-06-17 LAB — TYPE AND SCREEN
ABO/RH(D): AB POS
Antibody Screen: NEGATIVE
Unit division: 0

## 2024-06-17 LAB — BPAM RBC
Blood Product Expiration Date: 202510052359
ISSUE DATE / TIME: 202509090931
Unit Type and Rh: 202510052359
Unit Type and Rh: 6200

## 2024-06-17 LAB — MAGNESIUM: Magnesium: 2.3 mg/dL (ref 1.7–2.4)

## 2024-06-17 MED ORDER — ANASTROZOLE 1 MG PO TABS
1.0000 mg | ORAL_TABLET | Freq: Every day | ORAL | Status: DC
  Administered 2024-06-17 – 2024-06-18 (×2): 1 mg via ORAL
  Filled 2024-06-17 (×2): qty 1

## 2024-06-17 MED ORDER — LISINOPRIL 10 MG PO TABS
10.0000 mg | ORAL_TABLET | Freq: Every day | ORAL | Status: DC
  Administered 2024-06-17 – 2024-06-18 (×2): 10 mg via ORAL
  Filled 2024-06-17 (×2): qty 1

## 2024-06-17 NOTE — Progress Notes (Signed)
 UNASSIGNED PATIENT Subjective: Since I last evaluated the patient, she seems to be doing much better after intervention during endoscopy where she had multiple gastric ulcers and duodenal ulcers 1 of which was cauterized after injecting the base with epinephrine  and the clips were applied for hemostasis.  She denies having abdominal pain nausea vomiting at this time she has had 1 small-volume bowel movement today without any blood or mucus in the stool.  Objective: Vital signs in last 24 hours: Temp:  [98.1 F (36.7 C)-98.5 F (36.9 C)] 98.5 F (36.9 C) (09/10 1200) Pulse Rate:  [73-100] 74 (09/10 1300) Resp:  [16-29] 21 (09/10 1300) BP: (98-177)/(46-65) 168/65 (09/10 1300) SpO2:  [98 %-100 %] 100 % (09/10 1300) Last BM Date : 06/16/24  Intake/Output from previous day: 09/09 0701 - 09/10 0700 In: 617.5 [P.O.:240; Blood:377.5] Out: -  Intake/Output this shift: No intake/output data recorded.  General appearance: alert, cooperative, appears stated age, no distress, and pale Resp: clear to auscultation bilaterally Cardio: regular rate and rhythm, S1, S2 normal, no murmur, click, rub or gallop GI: soft, non-tender; bowel sounds normal; no masses,  no organomegaly Extremities: extremities normal, atraumatic, no cyanosis or edema  Lab Results: Recent Labs    06/15/24 1027 06/15/24 1722 06/16/24 0331 06/16/24 1451 06/17/24 0340  WBC 7.8  --  6.3  --  7.5  HGB 7.8*   < > 7.3* 10.3* 9.8*  HCT 22.9*   < > 22.8* 31.9* 29.8*  PLT 254  --  242  --  230   < > = values in this interval not displayed.   BMET Recent Labs    06/15/24 1027 06/16/24 0331 06/17/24 0340  NA 138 141 140  K 4.1 4.1 4.0  CL 105 109 109  CO2 25 22 18*  GLUCOSE 136* 99 89  BUN 31* 19 10  CREATININE 0.98 0.94 0.76  CALCIUM 9.4 8.8* 8.7*   LFT Recent Labs    06/16/24 0331  PROT 6.0*  ALBUMIN 3.7  AST 28  ALT 18  ALKPHOS 29*  BILITOT 0.3   Studies/Results: No results found.  Medications: I  have reviewed the patient's current medications. Prior to Admission:  Medications Prior to Admission  Medication Sig Dispense Refill Last Dose/Taking   acetaminophen  (TYLENOL ) 500 MG tablet Take 500 mg by mouth daily as needed for mild pain (pain score 1-3) or moderate pain (pain score 4-6).   06/15/2024 at  3:00 AM   anastrozole  (ARIMIDEX ) 1 MG tablet Take 1 tablet (1 mg total) by mouth daily. 90 tablet 3 06/15/2024   Calcium Carbonate-Vit D-Min (CALCIUM 1200 PO) Take 1 tablet by mouth daily.   06/15/2024   cholecalciferol (VITAMIN D) 1000 units tablet Take 1,000 Units by mouth daily.   06/15/2024   denosumab  (PROLIA ) 60 MG/ML SOSY injection Inject 60 mg into the skin every 6 (six) months.   Taking   folic acid  (FOLVITE ) 1 MG tablet Take 1 tablet (1 mg total) by mouth daily.   06/15/2024   lisinopril  (PRINIVIL ,ZESTRIL ) 10 MG tablet Take 10 mg by mouth daily.   06/15/2024   methotrexate (RHEUMATREX) 2.5 MG tablet Take 17.5 mg by mouth once a week.   06/14/2024   Multiple Vitamin (MULTIVITAMIN WITH MINERALS) TABS tablet Take 1 tablet by mouth daily.   06/15/2024   Multiple Vitamins-Minerals (PRESERVISION AREDS 2+MULTI VIT) CAPS Take 1 capsule by mouth daily.   06/15/2024   naproxen sodium (ALEVE) 220 MG tablet Take 220 mg by mouth daily  as needed (pain).   06/15/2024 at  3:00 AM   omega-3 acid ethyl esters (LOVAZA) 1 g capsule Take 1 g by mouth daily.   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 The avoidance of nonsteroidals has been discussed with her in great detail.  I think the patient can be discharged tomorrow and has been advised to see me in the office in the next 7 to 10 days. My office will call her to make an appointment.   2) Posthemorrhagic anemia. Hemoglobin stable at 9.8 g/dL s/p I unit of PRBC's.  3) Hypertension. 4) Stage IIIa chronic kidney disease  5) History of malignant neoplasm of the left breast-Anastrazole.  6) Rheumatoid arthritis.   [name] 06/17/2024, 3:10 PM

## 2024-06-17 NOTE — Plan of Care (Signed)

## 2024-06-17 NOTE — Plan of Care (Signed)
  Problem: Education: Goal: Knowledge of General Education information will improve Description Including pain rating scale, medication(s)/side effects and non-pharmacologic comfort measures Outcome: Progressing   Problem: Health Behavior/Discharge Planning: Goal: Ability to manage health-related needs will improve Outcome: Progressing   Problem: Clinical Measurements: Goal: Ability to maintain clinical measurements within normal limits will improve Outcome: Progressing Goal: Diagnostic test results will improve Outcome: Progressing Goal: Respiratory complications will improve Outcome: Progressing   Problem: Activity: Goal: Risk for activity intolerance will decrease Outcome: Progressing   Problem: Safety: Goal: Ability to remain free from injury will improve Outcome: Progressing

## 2024-06-17 NOTE — TOC Initial Note (Signed)
 Transition of Care [facility]) - Initial/Assessment Note    Patient Details  Name: Katelyn Fitzgerald MRN: [id number] Date of Birth: 15-Aug-1936  [facility]) CM/SW Contact:    Jon ONEIDA Anon, RN Phone Number: 06/17/2024, 3:28 PM  Clinical Narrative:                 Pt is from home. Needing continued medical workup, not medically stable for discharge. IPCM will continue to follow for any new recommendations or DC needs.     Expected Discharge Plan: Home/Self Care Barriers to Discharge: Continued Medical Work up   Patient Goals and CMS Choice Patient states their goals for this hospitalization and ongoing recovery are:: Return home CMS Medicare.gov Compare Post Acute Care list provided to:: Other (Comment Required) (NA) Choice offered to / list presented to : NA [place] ownership interest in [facility].provided to:: Parent NA    Expected Discharge Plan and Services In-house Referral: NA Discharge Planning Services: CM Consult Post Acute Care Choice: NA Living arrangements for the past 2 months: Apartment                 DME Arranged: N/A DME Agency: NA       HH Arranged: NA HH Agency: NA        Prior Living Arrangements/Services Living arrangements for the past 2 months: Apartment Lives with:: Relatives Patient language and need for interpreter reviewed:: Yes Do you feel safe going back to the place where you live?: Yes      Need for Family Participation in Patient Care: Yes (Comment) Care giver support system in place?: Yes (comment)   Criminal Activity/Legal Involvement Pertinent to Current Situation/Hospitalization: No - Comment as needed  Activities of Daily Living   ADL Screening (condition at time of admission) Independently performs ADLs?: Yes (appropriate for developmental age) Is the patient deaf or have difficulty hearing?: No Does the patient have difficulty seeing, even when wearing glasses/contacts?: No Does the patient have  difficulty concentrating, remembering, or making decisions?: No  Permission Sought/Granted Permission sought to share information with : Family Supports Permission granted to share information with : Yes, Verbal Permission Granted  Share Information with NAME: Johnae, Friley (Daughter)  [phone]           Emotional Assessment Appearance:: Appears stated age Attitude/Demeanor/Rapport: Unable to Assess Affect (typically observed): Unable to Assess Orientation: : Oriented to Self, Oriented to Place, Oriented to  Time, Oriented to Situation Alcohol / Substance Use: Not Applicable Psych Involvement: No (comment)  Admission diagnosis:  Acute GI bleeding [K92.2] UGI bleed [K92.2] Patient Active Problem List   Diagnosis Date Noted   UGI bleed 06/15/2024   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   ABLA (acute blood loss anemia) 06/15/2024   Essential hypertension 06/01/2024   Osteoarthritis 06/01/2024   Osteoporosis 06/01/2024   Primary osteoarthritis involving multiple joints 06/01/2024   Stage 3a chronic kidney disease (HCC) 06/01/2024   Genetic testing 01/10/2024   Rheumatoid arthritis involving multiple sites (HCC) 12/11/2023   Methotrexate, long term, current use 12/11/2023   Malignant neoplasm of overlapping sites of left breast in female, estrogen receptor positive (HCC) 12/10/2023   PCP:  Verdia Lombard, MD Pharmacy:   [facility] [phone] - [place], [place] - [address] AT CORNER OF [facility] [address] [place] [address] Phone: [phone] Fax: [phone]  [id number] - [place], [place] - [address] AT [facility] [address] [place]  [place] [address] Phone: [phone] Fax: [phone]     Social Drivers of Health (SDOH) Social History: SDOH Screenings   Food Insecurity: No Food Insecurity (06/15/2024)  Housing: Low Risk  (06/15/2024)  Transportation Needs: No Transportation Needs  (06/15/2024)  Utilities: Not At Risk (06/15/2024)  Depression (PHQ2-9): Low Risk  (12/11/2023)  Social Connections: Moderately Integrated (06/15/2024)  Tobacco Use: Low Risk  (06/16/2024)   SDOH Interventions:     Readmission Risk Interventions    06/17/2024    3:25 PM  Readmission Risk Prevention Plan  Post Dischage Appt Complete  Medication Screening Complete  Transportation Screening Complete

## 2024-06-17 NOTE — Progress Notes (Signed)
 Progress Note    Katelyn Fitzgerald   FMW:[id number]  DOB: 17-Oct-1935  DOA: 06/15/2024     2 PCP: Verdia Lombard, MD  Initial CC: dark stools  Hospital Course: Hang Ammon is a 88 y.o. female with medical history significant of osteoarthritis, rheumatoid arthritis involving multiple sites, stage IIIa CKD, lumbago with sciatic, breast cancer, hypertension, osteoporosis who presented to the emergency department complaints of multiple episodes of melena since Saturday morning associated with generalized weakness and dizziness.   [name] hemoglobin has decreased from 12.4 in late July to 7.8 g/dL on admission. She endorsed feeling increased rheumatoid pains prior to admission and was taking naproxen twice daily for approximately 2 weeks. FOBT was positive on admission. GI was consulted on admission.   Assessment and Plan:  UGIB - concern for PUD from recent naproxen use; felt was having worsening RA pains - Last hemoglobin 04/29/2024 noted at 12.4 g/dL.  On admission found to be 7.8 g/dL - Hgb down to 7.3 g/dL on 0/0/7974.  Transfused 1 unit PRBC - S/p EGD 06/16/2024: Multiple nonbleeding superficial gastric ulcers in antrum; 4 nonbleeding duodenal ulcers with nonbleeding visible vessel and 3 clean-based ulcers in duodenal bulb.  Treated with coagulation and hemostatic clips -Continue Protonix  and Carafate  per GI -Okay to advance diet today, discussed with GI -Monitoring in hospital 1 more night per GI recommendations - Repeat CBC in a.m.  Essential hypertension Continue lisinopril    Stage 3a chronic kidney disease (HCC) - patient has history of CKD3a. Baseline creat ~ 0.9 - 1, eGFR~ 58  Malignant neoplasm of overlapping sites of left breast in female, estrogen receptor positive (HCC) Follow-up with oncology as an outpatient as scheduled. -On anastrozole  outpatient - On Prolia   Rheumatoid arthritis - On methotrexate  Interval History:  No events overnight.  Reviewed  findings of EGD.  Daughter present and aware. Tolerated clear liquids fine post scope.  Old records reviewed in assessment of this patient  Antimicrobials:   DVT prophylaxis:  SCDs Start: 06/15/24 1349   Code Status:   Code Status: Full Code  Mobility Assessment (Last 72 Hours)     Mobility Assessment     Row Name 06/17/24 0715 06/16/24 2045 06/16/24 0800 06/15/24 2000 06/15/24 1700   Does the patient have exclusion criteria? No - Perform mobility assessment No - Perform mobility assessment No - Perform mobility assessment No - Perform mobility assessment No - Perform mobility assessment   What is the highest level of mobility based on the mobility assessment? Level 4 (Ambulates with assistance) - Balance while stepping forward/back - Complete Level 5 (Ambulates independently) - Balance while walking independently - Complete Level 5 (Ambulates independently) - Balance while walking independently - Complete Level 5 (Ambulates independently) - Balance while walking independently - Complete Level 5 (Ambulates independently) - Balance while walking independently - Complete      Barriers to discharge: None Disposition Plan: Home HH orders placed: N/A Status is: Inpatient  Objective: Blood pressure (!) 168/65, pulse 74, temperature 98.5 F (36.9 C), temperature source Oral, resp. rate (!) 21, height 5' 1 (1.549 m), weight 56 kg, SpO2 100%.  Examination:  Physical Exam Constitutional:      Appearance: Normal appearance.  HENT:     Head: Normocephalic and atraumatic.     Mouth/Throat:     Mouth: Mucous membranes are moist.  Eyes:     Extraocular Movements: Extraocular movements intact.  Cardiovascular:     Rate and Rhythm: Normal rate and regular rhythm.  Pulmonary:  Effort: Pulmonary effort is normal. No respiratory distress.     Breath sounds: Normal breath sounds. No wheezing.  Abdominal:     General: Bowel sounds are normal. There is no distension.     Palpations:  Abdomen is soft.     Tenderness: There is no abdominal tenderness.  Musculoskeletal:        General: Normal range of motion.     Cervical back: Normal range of motion and neck supple.  Skin:    General: Skin is warm and dry.  Neurological:     General: No focal deficit present.     Mental Status: She is alert.  Psychiatric:        Mood and Affect: Mood normal.      Consultants:  GI  Procedures:  06/16/2024: EGD  Data Reviewed: Results for orders placed or performed during the hospital encounter of 06/15/24 (from the past 24 hours)  Hemoglobin and hematocrit, blood     Status: Abnormal   Collection Time: 06/16/24  2:51 PM  Result Value Ref Range   Hemoglobin 10.3 (L) 12.0 - 15.0 g/dL   HCT 68.0 (L) 63.9 - 53.9 %  Basic metabolic panel with GFR     Status: Abnormal   Collection Time: 06/17/24  3:40 AM  Result Value Ref Range   Sodium 140 135 - 145 mmol/L   Potassium 4.0 3.5 - 5.1 mmol/L   Chloride 109 98 - 111 mmol/L   CO2 18 (L) 22 - 32 mmol/L   Glucose, Bld 89 70 - 99 mg/dL   BUN 10 8 - 23 mg/dL   Creatinine, Ser 9.23 0.44 - 1.00 mg/dL   Calcium 8.7 (L) 8.9 - 10.3 mg/dL   GFR, Estimated >39 >39 mL/min   Anion gap 13 5 - 15  CBC with Differential/Platelet     Status: Abnormal   Collection Time: 06/17/24  3:40 AM  Result Value Ref Range   WBC 7.5 4.0 - 10.5 K/uL   RBC 3.17 (L) 3.87 - 5.11 MIL/uL   Hemoglobin 9.8 (L) 12.0 - 15.0 g/dL   HCT 70.1 (L) 63.9 - 53.9 %   MCV 94.0 80.0 - 100.0 fL   MCH 30.9 26.0 - 34.0 pg   MCHC 32.9 30.0 - 36.0 g/dL   RDW 84.4 88.4 - 84.4 %   Platelets 230 150 - 400 K/uL   nRBC 0.0 0.0 - 0.2 %   Neutrophils Relative % 74 %   Neutro Abs 5.7 1.7 - 7.7 K/uL   Lymphocytes Relative 14 %   Lymphs Abs 1.0 0.7 - 4.0 K/uL   Monocytes Relative 8 %   Monocytes Absolute 0.6 0.1 - 1.0 K/uL   Eosinophils Relative 2 %   Eosinophils Absolute 0.1 0.0 - 0.5 K/uL   Basophils Relative 1 %   Basophils Absolute 0.1 0.0 - 0.1 K/uL   Immature Granulocytes  1 %   Abs Immature Granulocytes 0.06 0.00 - 0.07 K/uL  Magnesium     Status: None   Collection Time: 06/17/24  3:40 AM  Result Value Ref Range   Magnesium 2.3 1.7 - 2.4 mg/dL    I have reviewed pertinent nursing notes, vitals, labs, and images as necessary. I have ordered labwork to follow up on as indicated.  I have reviewed the last notes from staff over past 24 hours. I have discussed patient's care plan and test results with nursing staff, CM/SW, and other staff as appropriate.  Time spent: Greater than 50% of the  55 minute visit was spent in counseling/coordination of care for the patient as laid out in the A&P.   LOS: 2 days   Alm Apo, MD [facility] 06/17/2024, 1:53 PM

## 2024-06-18 ENCOUNTER — Other Ambulatory Visit (HOSPITAL_COMMUNITY): Payer: Self-pay

## 2024-06-18 DIAGNOSIS — K922 Gastrointestinal hemorrhage, unspecified: Secondary | ICD-10-CM | POA: Diagnosis not present

## 2024-06-18 DIAGNOSIS — D62 Acute posthemorrhagic anemia: Secondary | ICD-10-CM | POA: Diagnosis not present

## 2024-06-18 LAB — CBC WITH DIFFERENTIAL/PLATELET
Abs Immature Granulocytes: 0.09 K/uL — ABNORMAL HIGH (ref 0.00–0.07)
Basophils Absolute: 0.1 K/uL (ref 0.0–0.1)
Basophils Relative: 1 %
Eosinophils Absolute: 0.2 K/uL (ref 0.0–0.5)
Eosinophils Relative: 3 %
HCT: 32.3 % — ABNORMAL LOW (ref 36.0–46.0)
Hemoglobin: 10.6 g/dL — ABNORMAL LOW (ref 12.0–15.0)
Immature Granulocytes: 1 %
Lymphocytes Relative: 15 %
Lymphs Abs: 1.1 K/uL (ref 0.7–4.0)
MCH: 30.7 pg (ref 26.0–34.0)
MCHC: 32.8 g/dL (ref 30.0–36.0)
MCV: 93.6 fL (ref 80.0–100.0)
Monocytes Absolute: 0.6 K/uL (ref 0.1–1.0)
Monocytes Relative: 8 %
Neutro Abs: 5.4 K/uL (ref 1.7–7.7)
Neutrophils Relative %: 72 %
Platelets: 288 K/uL (ref 150–400)
RBC: 3.45 MIL/uL — ABNORMAL LOW (ref 3.87–5.11)
RDW: 15.1 % (ref 11.5–15.5)
WBC: 7.4 K/uL (ref 4.0–10.5)
nRBC: 0 % (ref 0.0–0.2)

## 2024-06-18 LAB — MAGNESIUM: Magnesium: 2.5 mg/dL — ABNORMAL HIGH (ref 1.7–2.4)

## 2024-06-18 LAB — BASIC METABOLIC PANEL WITH GFR
Anion gap: 13 (ref 5–15)
BUN: 11 mg/dL (ref 8–23)
CO2: 19 mmol/L — ABNORMAL LOW (ref 22–32)
Calcium: 8.8 mg/dL — ABNORMAL LOW (ref 8.9–10.3)
Chloride: 109 mmol/L (ref 98–111)
Creatinine, Ser: 0.81 mg/dL (ref 0.44–1.00)
GFR, Estimated: >60 mL/min (ref >60)
Glucose, Bld: 96 mg/dL (ref 70–99)
Potassium: 4 mmol/L (ref 3.5–5.1)
Sodium: 140 mmol/L (ref 135–145)

## 2024-06-18 LAB — SURGICAL PATHOLOGY

## 2024-06-18 MED ORDER — SUCRALFATE 1 G PO TABS
1.0000 g | ORAL_TABLET | Freq: Three times a day (TID) | ORAL | 2 refills | Status: DC
  Filled 2024-06-18: qty 120, 30d supply, fill #0

## 2024-06-18 MED ORDER — ACETAMINOPHEN 500 MG PO TABS: 1000.0000 mg | ORAL_TABLET | Freq: Four times a day (QID) | ORAL | Status: DC | PRN

## 2024-06-18 MED ORDER — PANTOPRAZOLE SODIUM 40 MG PO TBEC
40.0000 mg | DELAYED_RELEASE_TABLET | Freq: Two times a day (BID) | ORAL | 3 refills | Status: DC
  Filled 2024-06-18: qty 60, 30d supply, fill #0

## 2024-06-18 NOTE — Discharge Summary (Signed)
 Physician Discharge Summary   Katelyn Fitzgerald FMW:[id number] DOB: [birth year] DOA: 06/15/2024  PCP: Verdia Lombard, MD  Admit date: 06/15/2024 Discharge date: 06/18/2024  Admitted From: Home Disposition:  Home Discharging physician: Alm Apo, MD Barriers to discharge: none  Recommendations at discharge: Follow up with GI  Discharge Condition: stable CODE STATUS: Full  Diet recommendation:  Diet Orders (From admission, onward)     Start     Ordered   06/18/24 0000  Diet general        06/18/24 1024   06/17/24 1233  Diet regular Fluid consistency: Thin  Diet effective now       Question:  Fluid consistency:  Answer:  Thin   06/17/24 1232            Hospital Course: Katelyn Fitzgerald is a 88 y.o. female with medical history significant of osteoarthritis, rheumatoid arthritis involving multiple sites, stage IIIa CKD, lumbago with sciatic, breast cancer, hypertension, osteoporosis who presented to the emergency department complaints of multiple episodes of melena since Saturday morning associated with generalized weakness and dizziness.   [name] hemoglobin has decreased from 12.4 in late July to 7.8 g/dL on admission. She endorsed feeling increased rheumatoid pains prior to admission and was taking naproxen twice daily for approximately 2 weeks. FOBT was positive on admission. GI was consulted on admission.   Assessment and Plan:  UGIB - concern for PUD from recent naproxen use; felt was having worsening RA pains - Last hemoglobin 04/29/2024 noted at 12.4 g/dL.  On admission found to be 7.8 g/dL - Hgb down to 7.3 g/dL on 0/0/7974.  Transfused 1 unit PRBC - S/p EGD 06/16/2024: Multiple nonbleeding superficial gastric ulcers in antrum; 4 nonbleeding duodenal ulcers with nonbleeding visible vessel and 3 clean-based ulcers in duodenal bulb.  Treated with coagulation and hemostatic clips - Tolerated diet advancement -Hgb continued to improve prior to discharge, 10.6 g/dL at  discharge -continued on Protonix  and Carafate ; outpatient follow-up with GI planned  Essential hypertension Continue lisinopril    Stage 3a chronic kidney disease (HCC) - patient has history of CKD3a. Baseline creat ~ 0.9 - 1, eGFR~ 58  Malignant neoplasm of overlapping sites of left breast in female, estrogen receptor positive (HCC) Follow-up with oncology as an outpatient as scheduled. -On anastrozole  outpatient - On Prolia   Rheumatoid arthritis - On methotrexate  Assessment and Plan: No notes have been filed under this hospital service. Service: Hospitalist      [name] acute and chronic medical conditions were treated accordingly. On day of discharge, patient was felt deemed stable for discharge. Patient/family member advised to call PCP or come back to ER if needed.   Principal Diagnosis: UGI bleed  Discharge Diagnoses: Active Hospital Problems   Diagnosis Date Noted   UGI bleed 06/15/2024   ABLA (acute blood loss anemia) 06/15/2024   Essential hypertension 06/01/2024   Stage 3a chronic kidney disease (HCC) 06/01/2024   Malignant neoplasm of overlapping sites of left breast in female, estrogen receptor positive (HCC) 12/10/2023    Resolved Hospital Problems  No resolved problems to display.     Discharge Instructions     Diet general   Complete by: As directed    Increase activity slowly   Complete by: As directed       Allergies as of 06/18/2024   No Known Allergies      Medication List     STOP taking these medications    naproxen sodium 220 MG tablet Commonly known as: ALEVE  TAKE these medications    acetaminophen  500 MG tablet Commonly known as: TYLENOL  Take 2 tablets (1,000 mg total) by mouth every 6 (six) hours as needed for mild pain (pain score 1-3) or moderate pain (pain score 4-6). What changed:  how much to take when to take this   anastrozole  1 MG tablet Commonly known as: ARIMIDEX  Take 1 tablet (1 mg total) by  mouth daily.   CALCIUM 1200 PO Take 1 tablet by mouth daily.   cholecalciferol 1000 units tablet Commonly known as: VITAMIN D Take 1,000 Units by mouth daily.   folic acid  1 MG tablet Commonly known as: FOLVITE  Take 1 tablet (1 mg total) by mouth daily.   lisinopril  10 MG tablet Commonly known as: ZESTRIL  Take 10 mg by mouth daily.   methotrexate 2.5 MG tablet Commonly known as: RHEUMATREX Take 17.5 mg by mouth once a week.   multivitamin with minerals Tabs tablet Take 1 tablet by mouth daily.   omega-3 acid ethyl esters 1 g capsule Commonly known as: LOVAZA Take 1 g by mouth daily.   pantoprazole  40 MG tablet Commonly known as: Protonix  Take 1 tablet (40 mg total) by mouth 2 (two) times daily.   PreserVision AREDS 2+Multi Vit Caps Take 1 capsule by mouth daily.   Prolia  60 MG/ML Sosy injection Generic drug: denosumab  Inject 60 mg into the skin every 6 (six) months.   sucralfate  1 g tablet Commonly known as: CARAFATE  Take 1 tablet (1 g total) by mouth 4 (four) times daily -  with meals and at bedtime.        No Known Allergies  Consultations: GI   Procedures: 06/16/2024: EGD   Discharge Exam: BP (!) 140/64 (BP Location: Right Arm)   Pulse 91   Temp 98.9 F (37.2 C) (Oral)   Resp 14   Ht 5' 1 (1.549 m)   Wt 54.1 kg   SpO2 98%   BMI 22.54 kg/m  Physical Exam Constitutional:      Appearance: Normal appearance.  HENT:     Head: Normocephalic and atraumatic.     Mouth/Throat:     Mouth: Mucous membranes are moist.  Eyes:     Extraocular Movements: Extraocular movements intact.  Cardiovascular:     Rate and Rhythm: Normal rate and regular rhythm.  Pulmonary:     Effort: Pulmonary effort is normal. No respiratory distress.     Breath sounds: Normal breath sounds. No wheezing.  Abdominal:     General: Bowel sounds are normal. There is no distension.     Palpations: Abdomen is soft.     Tenderness: There is no abdominal tenderness.   Musculoskeletal:        General: Normal range of motion.     Cervical back: Normal range of motion and neck supple.  Skin:    General: Skin is warm and dry.  Neurological:     General: No focal deficit present.     Mental Status: She is alert.  Psychiatric:        Mood and Affect: Mood normal.      The results of significant diagnostics from this hospitalization (including imaging, microbiology, ancillary and laboratory) are listed below for reference.   Microbiology: Recent Results (from the past 240 hours)  MRSA Next Gen by PCR, Nasal     Status: None   Collection Time: 06/15/24  4:12 PM   Specimen: Nasal Mucosa; Nasal Swab  Result Value Ref Range Status   MRSA by PCR  Next Gen NOT DETECTED NOT DETECTED Final    Comment: (NOTE) The GeneXpert MRSA Assay (FDA approved for NASAL specimens only), is one component of a comprehensive MRSA colonization surveillance program. It is not intended to diagnose MRSA infection nor to guide or monitor treatment for MRSA infections. Test performance is not FDA approved in patients less than 20 years old. Performed at [facility], [address], [place], [place] [address]      Labs: BNP (last 3 results) No results for input(s): BNP in the last 8760 hours. Basic Metabolic Panel: Recent Labs  Lab 06/15/24 1027 06/16/24 0331 06/17/24 0340 06/18/24 0537  NA 138 141 140 140  K 4.1 4.1 4.0 4.0  CL 105 109 109 109  CO2 25 22 18* 19*  GLUCOSE 136* 99 89 96  BUN 31* 19 10 11   CREATININE 0.98 0.94 0.76 0.81  CALCIUM 9.4 8.8* 8.7* 8.8*  MG  --   --  2.3 2.5*   Liver Function Tests: Recent Labs  Lab 06/15/24 1027 06/16/24 0331  AST 25 28  ALT 15 18  ALKPHOS 23* 29*  BILITOT 0.3 0.3  PROT 6.5 6.0*  ALBUMIN 3.8 3.7   No results for input(s): LIPASE, AMYLASE in the last 168 hours. No results for input(s): AMMONIA in the last 168 hours. CBC: Recent Labs  Lab 06/15/24 1027 06/15/24 1722  06/15/24 2139 06/16/24 0331 06/16/24 1451 06/17/24 0340 06/18/24 0537  WBC 7.8  --   --  6.3  --  7.5 7.4  NEUTROABS 6.1  --   --   --   --  5.7 5.4  HGB 7.8*   < > 7.3* 7.3* 10.3* 9.8* 10.6*  HCT 22.9*   < > 22.7* 22.8* 31.9* 29.8* 32.3*  MCV 91.2  --   --  96.6  --  94.0 93.6  PLT 254  --   --  242  --  230 288   < > = values in this interval not displayed.   Cardiac Enzymes: No results for input(s): CKTOTAL, CKMB, CKMBINDEX, TROPONINI in the last 168 hours. BNP: Invalid input(s): POCBNP CBG: No results for input(s): GLUCAP in the last 168 hours. D-Dimer No results for input(s): DDIMER in the last 72 hours. Hgb A1c No results for input(s): HGBA1C in the last 72 hours. Lipid Profile No results for input(s): CHOL, HDL, LDLCALC, TRIG, CHOLHDL, LDLDIRECT in the last 72 hours. Thyroid  function studies No results for input(s): TSH, T4TOTAL, T3FREE, THYROIDAB in the last 72 hours.  Invalid input(s): FREET3 Anemia work up No results for input(s): VITAMINB12, FOLATE, FERRITIN, TIBC, IRON, RETICCTPCT in the last 72 hours. Urinalysis    Component Value Date/Time   COLORURINE YELLOW 10/05/2016 1605   APPEARANCEUR CLEAR 10/05/2016 1605   LABSPEC 1.014 10/05/2016 1605   PHURINE 5.0 10/05/2016 1605   GLUCOSEU NEGATIVE 10/05/2016 1605   HGBUR NEGATIVE 10/05/2016 1605   BILIRUBINUR NEGATIVE 10/05/2016 1605   KETONESUR NEGATIVE 10/05/2016 1605   PROTEINUR NEGATIVE 10/05/2016 1605   NITRITE NEGATIVE 10/05/2016 1605   LEUKOCYTESUR NEGATIVE 10/05/2016 1605   Sepsis Labs Recent Labs  Lab 06/15/24 1027 06/16/24 0331 06/17/24 0340 06/18/24 0537  WBC 7.8 6.3 7.5 7.4   Microbiology Recent Results (from the past 240 hours)  MRSA Next Gen by PCR, Nasal     Status: None   Collection Time: 06/15/24  4:12 PM   Specimen: Nasal Mucosa; Nasal Swab  Result Value Ref Range Status   MRSA by PCR Next  Gen NOT DETECTED NOT DETECTED Final     Comment: (NOTE) The GeneXpert MRSA Assay (FDA approved for NASAL specimens only), is one component of a comprehensive MRSA colonization surveillance program. It is not intended to diagnose MRSA infection nor to guide or monitor treatment for MRSA infections. Test performance is not FDA approved in patients less than 67 years old. Performed at [facility], [address] [place], [place] [address]     Procedures/Studies: MM 3D DIAGNOSTIC MAMMOGRAM UNILATERAL LEFT BREAST Result Date: 06/05/2024 CLINICAL DATA:  Evaluation for treatment response. Patient has history of multifocal right breast cancer and axillary involvement, diagnosed in February 2025. She has been undergoing new adjuvant hormonal therapy. No surgery to date. EXAM: DIGITAL DIAGNOSTIC UNILATERAL LEFT MAMMOGRAM WITH TOMOSYNTHESIS AND CAD; ULTRASOUND LEFT BREAST LIMITED TECHNIQUE: Left digital diagnostic mammography and breast tomosynthesis was performed. The images were evaluated with computer-aided detection. ; Targeted ultrasound examination of the left breast was performed. COMPARISON:  Previous exam(s). ACR Breast Density Category b: There are scattered areas of fibroglandular density. FINDINGS: Mammogram: Full field tomosynthesis views of the left breast was performed. The biopsied irregular spiculated mass with heart shaped clip in the upper outer breast appears decreased in size compared to prior. Another irregular spiculated mass in the left axilla with HydroMARK clip is also visualized but not well seen on the prior imaging so difficult to compare. No new suspicious finding identified in the left breast. Ultrasound: Targeted ultrasound is performed in the left breast at 10 o'clock 11 cm from the nipple demonstrating an irregular hypoechoic mass measuring 2.2 x 0.8 x 1.6 cm, previously measuring 2.4 x 1.6 x 2.7 cm. At 2 o'clock 7 cm from the nipple there is an irregular hypoechoic mass with coarse calcification  measuring 2.7 x 1.8 x 3.0 cm, previously measuring 4.1 x 2.9 x 4.2 cm. In the left axilla there is an irregular hypoechoic mass measuring 1.4 x 1.3 x 1.3 cm, previously measuring 2.4 x 2.0 x 2.2 cm. Also in the left axilla there is an indistinct hypoechoic mass which is less well visualized compared to prior study measuring up to 0.7 cm, previously measuring up to 1.4 cm, the likely representing a replaced lymph node. IMPRESSION: Interval decrease in size of the three biopsy-proven malignant masses in the left breast and axilla. The largest mass at 2 o'clock in the left breast now measures up to 3.0 cm, previously 4.2 cm. Interval decrease in size of a 4th small mass in the left axilla favored to represent a replaced lymph node. RECOMMENDATION: Continue treatment plan for known left breast cancer. I have discussed the findings and recommendations with the patient. If applicable, a reminder letter will be sent to the patient regarding the next appointment. BI-RADS CATEGORY  6: Known biopsy-proven malignancy. Electronically Signed   By: Inocente Ast M.D.   On: 06/05/2024 10:21   US  LIMITED ULTRASOUND INCLUDING AXILLA LEFT BREAST  Result Date: 06/05/2024 CLINICAL DATA:  Evaluation for treatment response. Patient has history of multifocal right breast cancer and axillary involvement, diagnosed in February 2025. She has been undergoing new adjuvant hormonal therapy. No surgery to date. EXAM: DIGITAL DIAGNOSTIC UNILATERAL LEFT MAMMOGRAM WITH TOMOSYNTHESIS AND CAD; ULTRASOUND LEFT BREAST LIMITED TECHNIQUE: Left digital diagnostic mammography and breast tomosynthesis was performed. The images were evaluated with computer-aided detection. ; Targeted ultrasound examination of the left breast was performed. COMPARISON:  Previous exam(s). ACR Breast Density Category b: There are scattered areas of fibroglandular density. FINDINGS: Mammogram: Full  field tomosynthesis views of the left breast was performed. The biopsied  irregular spiculated mass with heart shaped clip in the upper outer breast appears decreased in size compared to prior. Another irregular spiculated mass in the left axilla with HydroMARK clip is also visualized but not well seen on the prior imaging so difficult to compare. No new suspicious finding identified in the left breast. Ultrasound: Targeted ultrasound is performed in the left breast at 10 o'clock 11 cm from the nipple demonstrating an irregular hypoechoic mass measuring 2.2 x 0.8 x 1.6 cm, previously measuring 2.4 x 1.6 x 2.7 cm. At 2 o'clock 7 cm from the nipple there is an irregular hypoechoic mass with coarse calcification measuring 2.7 x 1.8 x 3.0 cm, previously measuring 4.1 x 2.9 x 4.2 cm. In the left axilla there is an irregular hypoechoic mass measuring 1.4 x 1.3 x 1.3 cm, previously measuring 2.4 x 2.0 x 2.2 cm. Also in the left axilla there is an indistinct hypoechoic mass which is less well visualized compared to prior study measuring up to 0.7 cm, previously measuring up to 1.4 cm, the likely representing a replaced lymph node. IMPRESSION: Interval decrease in size of the three biopsy-proven malignant masses in the left breast and axilla. The largest mass at 2 o'clock in the left breast now measures up to 3.0 cm, previously 4.2 cm. Interval decrease in size of a 4th small mass in the left axilla favored to represent a replaced lymph node. RECOMMENDATION: Continue treatment plan for known left breast cancer. I have discussed the findings and recommendations with the patient. If applicable, a reminder letter will be sent to the patient regarding the next appointment. BI-RADS CATEGORY  6: Known biopsy-proven malignancy. Electronically Signed   By: Inocente Ast M.D.   On: 06/05/2024 10:21   DG Sacrum/Coccyx Result Date: 06/01/2024 CLINICAL DATA:  Pain, breast cancer. EXAM: DG SACRUM/COCCYX 2+V COMPARISON:  None Available. FINDINGS: No worrisome lytic or sclerotic lesions.  No fracture.  IMPRESSION: No acute findings. Electronically Signed   By: Newell Eke M.D.   On: 06/01/2024 11:43   DG Lumbar Spine 2-3 Views Result Date: 06/01/2024 CLINICAL DATA:  Low back pain, breast cancer. EXAM: LUMBAR SPINE - 2-3 VIEW COMPARISON:  None Available. FINDINGS: Minimal grade 1 anterolisthesis of L4 on L5. Alignment is otherwise anatomic. Vertebral body heights are maintained. Multilevel endplate degenerative changes. Probable mild loss of disc space height at L5-S1. Facet hypertrophy in the lower lumbar spine. No definite lytic or sclerotic lesions. IMPRESSION: 1. No definitive evidence of metastatic disease. 2. Multilevel degenerative disc disease, worst at L5-S1. 3. Minimal grade 1 anterolisthesis of L4 on L5. Electronically Signed   By: Newell Eke M.D.   On: 06/01/2024 11:42   DG HIP UNILAT WITH PELVIS MIN 4 VIEWS LEFT Result Date: 06/01/2024 CLINICAL DATA:  Low back pain, breast cancer. EXAM: DG HIP (WITH OR WITHOUT PELVIS) 4+V LEFT COMPARISON:  None Available. FINDINGS: Hip joint space is symmetric bilaterally. No worrisome lytic or sclerotic lesions. Degenerative changes in the spine. IMPRESSION: 1. No acute findings.  No evidence of metastatic disease. 2. Degenerative changes in the lumbar spine. Electronically Signed   By: Newell Eke M.D.   On: 06/01/2024 11:41     Time coordinating discharge: Over 30 minutes    Alm Apo, MD  [facility] 06/18/2024, 2:36 PM

## 2024-06-18 NOTE — Progress Notes (Signed)
Discharge medications delivered to patient at bedside.

## 2024-07-01 DIAGNOSIS — Z09 Encounter for follow-up examination after completed treatment for conditions other than malignant neoplasm: Secondary | ICD-10-CM | POA: Diagnosis not present

## 2024-08-20 ENCOUNTER — Ambulatory Visit (HOSPITAL_COMMUNITY)
Admission: RE | Admit: 2024-08-20 | Discharge: 2024-08-20 | Disposition: A | Discharge status: Home / Self Care | Payer: Medicare (Managed Care) | Source: Ambulatory Visit | Attending: Hematology and Oncology | Admitting: Hematology and Oncology

## 2024-08-20 DIAGNOSIS — C50812 Malignant neoplasm of overlapping sites of left female breast: Secondary | ICD-10-CM | POA: Insufficient documentation

## 2024-08-20 DIAGNOSIS — N6321 Unspecified lump in the left breast, upper outer quadrant: Secondary | ICD-10-CM | POA: Diagnosis not present

## 2024-08-20 DIAGNOSIS — Z170 Estrogen receptor positive status [ER+]: Secondary | ICD-10-CM | POA: Insufficient documentation

## 2024-08-20 DIAGNOSIS — C761 Malignant neoplasm of thorax: Secondary | ICD-10-CM | POA: Diagnosis not present

## 2024-08-20 DIAGNOSIS — R918 Other nonspecific abnormal finding of lung field: Secondary | ICD-10-CM | POA: Diagnosis not present

## 2024-08-20 DIAGNOSIS — R591 Generalized enlarged lymph nodes: Secondary | ICD-10-CM | POA: Diagnosis not present

## 2024-08-21 ENCOUNTER — Other Ambulatory Visit: Payer: Medicare (Managed Care)

## 2024-08-27 ENCOUNTER — Other Ambulatory Visit (HOSPITAL_COMMUNITY): Payer: Self-pay

## 2024-08-27 ENCOUNTER — Ambulatory Visit: Payer: Medicare (Managed Care) | Attending: Hematology and Oncology

## 2024-08-27 ENCOUNTER — Telehealth: Payer: Self-pay | Admitting: Pharmacist

## 2024-08-27 ENCOUNTER — Telehealth: Payer: Self-pay

## 2024-08-27 ENCOUNTER — Inpatient Hospital Stay: Payer: Medicare (Managed Care) | Attending: Hematology and Oncology | Admitting: Hematology and Oncology

## 2024-08-27 ENCOUNTER — Other Ambulatory Visit: Payer: Self-pay

## 2024-08-27 VITALS — BP 138/80 | HR 103 | Temp 98.1°F | Resp 20 | Ht 61.0 in | Wt 123.2 lb

## 2024-08-27 DIAGNOSIS — Z1721 Progesterone receptor positive status: Secondary | ICD-10-CM | POA: Insufficient documentation

## 2024-08-27 DIAGNOSIS — Z79631 Long term (current) use of antimetabolite agent: Secondary | ICD-10-CM | POA: Diagnosis not present

## 2024-08-27 DIAGNOSIS — C50812 Malignant neoplasm of overlapping sites of left female breast: Secondary | ICD-10-CM | POA: Insufficient documentation

## 2024-08-27 DIAGNOSIS — Z170 Estrogen receptor positive status [ER+]: Secondary | ICD-10-CM | POA: Insufficient documentation

## 2024-08-27 DIAGNOSIS — I890 Lymphedema, not elsewhere classified: Secondary | ICD-10-CM | POA: Insufficient documentation

## 2024-08-27 DIAGNOSIS — Z79811 Long term (current) use of aromatase inhibitors: Secondary | ICD-10-CM | POA: Diagnosis not present

## 2024-08-27 DIAGNOSIS — Z79899 Other long term (current) drug therapy: Secondary | ICD-10-CM | POA: Diagnosis not present

## 2024-08-27 DIAGNOSIS — Z1732 Human epidermal growth factor receptor 2 negative status: Secondary | ICD-10-CM | POA: Insufficient documentation

## 2024-08-27 MED ORDER — ABEMACICLIB 100 MG PO TABS: 100.0000 mg | ORAL_TABLET | Freq: Two times a day (BID) | ORAL | 3 refills | Status: AC

## 2024-08-27 MED ORDER — LETROZOLE 2.5 MG PO TABS: 2.5000 mg | ORAL_TABLET | Freq: Every day | ORAL | 3 refills | Status: AC

## 2024-08-27 NOTE — Telephone Encounter (Signed)
 Oral Oncology Patient Advocate Encounter  Prior Authorization for Verzenio  has been approved.    PA# [id number] Effective dates: 07/28/24 through 08/27/25  Patients co-pay is $1,548.99  I will follow up to see if copay assistance is needed    Charlott Hamilton,  CPhT-Adv  she/her/hers [place]  [facility] Pharmacy Technician Patient Advocate Specialist III [facility] Phone: [phone]  Fax: [phone] [email or web address]

## 2024-08-27 NOTE — Assessment & Plan Note (Signed)
 12/05/2023:Patient presented with 2 palpable lumps in the left breast 10:00: 2.7 cm with skin tethering, 2:00: 4.2 cm, 3 masses in the left axilla possibly lymph nodes.  Biopsy of the first mass was IDC grade 2, ER 95%, PR 70%, Ki67 20%, HER2 negative; biopsy of second mass grade 3 IDC ER 70%, PR 60%, Ki67 90%, HER2 negative, biopsy of the lymph node grade 3 IDC    12/20/2023: Breast MRI: Left breast: 5.3 cm mass UOQ abuts pectoralis muscle and skin, multiple satellite nodules and non-mass enhancement (entire area 8.8 cm), 3.7 cm irregular mass UIQ abuts pectoralis muscle and skin, 3 cm mass left axilla, mild diffuse skin thickening, 3 lymph nodes enlarged   12/25/2023: CT CAP: Left breast masses and left axillary and subpectoral lymph nodes, multiple small bilateral pulmonary nodules highly suspicious for lung metastases (0.8 cm, 0.8 cm, 1 cm)   01/07/2024: Bone scan: Negative   Treatment plan: Neoadjuvant antiestrogen therapy with anastrozole  started April 2025 Mastectomy with targeted node surgery Adjuvant radiation therapy Adjuvant antiestrogen therapy ----------------------------------------------------------------------------------------- 01/20/2024: PET/CT: Hypermetabolic left breast cancer, soft tissue nodule abutting left pectoralis major is smaller, several hypermetabolic nodes left axilla and supraclavicular are slightly smaller, left upper mediastinal lymph node, small lung nodules some appear to be smaller 01/07/2024: Bone scan: Normal 05/05/24: Stable Left breast mass. Soft tissue mass left axilla/ lateral margin of pectoralis (similar to before), stable left lower paratracheal LN. lung nodules: Stable 06/05/2024: Mammogram and ultrasound: Decrease in the masses in the left breast and axilla was 4.2 cm is now 3 cm 08/23/2024: CT chest: Stable left breast masses and skin thickening, mild increase in the left axillary lymphadenopathy and new mild left supraclavicular lymphadenopathy, stable  bilateral pulmonary nodules  Discussion: Based upon the recent CT findings it is concerning that there has been an increase in the left axillary lymphadenopathy as well as a new left supraclavicular lymphadenopathy.  I recommend adding Verzinio to her treatment with anastrozole   Abemaciclib counseling: I discussed at length the risks and benefits of Abemaciclib in combination with letrozole. Adverse effects of Abemaciclib include decreasing neutrophil count, pneumonia, blood clots in lungs as well as nausea and GI symptoms. Side effects of letrozole include hot flashes, muscle aches and pains, uterine bleeding/spotting/cancer, osteoporosis, risk of blood clots.  Return to clinic in 1 week for education

## 2024-08-27 NOTE — Telephone Encounter (Signed)
 [facility]        Telephone: [phone]?Fax: [phone]   Oncology Clinical Pharmacist Practitioner Encounter   Received new prescription for abemaciclib  for the treatment of breast cancer. This is being given in combination with letrozole . It is planned to continue until disease progression or unacceptable toxicity.  Labs will be evaluated prior to starting as last labs are from September. Prescription dose and frequency assessed.   Current medication list in Epic reviewed. Significant DDIs with abemaciclib  identified:No.  Evaluated chart. Patient barriers to medication adherence identified: No.  Patient agreement for treatment documented in physician note on 08/27/24.  Prescription has been e-scribed to the [facility] for benefits analysis and approval.  Oral Oncology Clinic will continue to follow for insurance authorization, copayment issues, initial counseling and start date.  TRUE Garciamartinez A. Lucila, PharmD, BCOP, CPP Hematology-Oncology Clinical Pharmacist Practitioner  08/27/2024 3:10 PM  **Disclaimer: This note was dictated with voice recognition software. Similar sounding words can inadvertently be transcribed and this note may contain transcription errors which may not have been corrected upon publication of note.**

## 2024-08-27 NOTE — Telephone Encounter (Signed)
 Oral Oncology Patient Advocate Encounter   Received notification that prior authorization for Verzenio  is required.   PA submitted on 08/27/24 Key [id number] Status is pending      Charlott Hamilton,  CPhT-Adv  she/her/hers [facility]  [facility] Pharmacy Technician Patient Advocate Specialist III [facility] Phone: [phone]  Fax: [phone] [email or web address]

## 2024-08-27 NOTE — Progress Notes (Signed)
 Patient Care Team: Verdia Lombard, MD as PCP - General (Internal Medicine) Tyree Nanetta SAILOR, RN as Oncology Nurse Navigator Odean Potts, MD as Consulting Physician (Hematology and Oncology) Dewey Rush, MD as Consulting Physician (Radiation Oncology) Aron Shoulders, MD as Consulting Physician (General Surgery)  DIAGNOSIS:  Encounter Diagnosis  Name Primary?   Malignant neoplasm of overlapping sites of left breast in female, estrogen receptor positive (HCC) Yes    SUMMARY OF ONCOLOGIC HISTORY: Oncology History  Malignant neoplasm of overlapping sites of left breast in female, estrogen receptor positive (HCC)  12/05/2023 Initial Diagnosis   Patient presented with 2 palpable lumps in the left breast 10:00: 2.7 cm with skin tethering, 2:00: 4.2 cm, 3 masses in the left axilla possibly lymph nodes.  Biopsy of the first mass was IDC grade 2, ER 95%, PR 70%, Ki67 20%, HER2 negative; biopsy of second mass grade 3 IDC ER 70%, PR 60%, Ki67 90%, HER2 negative, biopsy of the lymph node grade 3 IDC   12/11/2023 Cancer Staging   Staging form: Breast, AJCC 8th Edition - Clinical stage from 12/11/2023: Stage IIB (cT2, cN1, cM0, G3, ER+, PR+, HER2-) - Signed by Odean Potts, MD on 12/11/2023 Stage prefix: Initial diagnosis Histologic grading system: 3 grade system   01/02/2024 Genetic Testing   Negative [facility] Cancer[facility] +RNAinsight Panel.  Report date is 01/02/2024.  The [facility] CancerNext+RNAinsight Panel includes sequencing, rearrangement analysis, and RNA analysis for the following 39 genes: APC, ATM, BAP1, BARD1, BMPR1A, BRCA1, BRCA2, BRIP1, CDH1, CDKN2A, CHEK2, FH, FLCN, MET, MLH1, MSH2, MSH6, MUTYH, NF1, NTHL1, PALB2, PMS2, PTEN, RAD51C, RAD51D, SMAD4, STK11, TP53, TSC1, TSC2, and VHL (sequencing and deletion/duplication); AXIN2, HOXB13, MBD4, MSH3, POLD1 and POLE (sequencing only); EPCAM and GREM1 (deletion/duplication only).   12/2023 -  Anti-estrogen oral therapy   Anastrozole  daily    01/20/2024 PET scan   PET/CT: Hypermetabolic left breast cancer, soft tissue nodule abutting left pectoralis major is smaller, several hypermetabolic nodes left axilla and supraclavicular are slightly smaller, left upper mediastinal lymph node, small lung nodules some appear to be smaller      CHIEF COMPLIANT: Follow-up to discuss results of scans  HISTORY OF PRESENT ILLNESS:   History of Present Illness Katelyn Fitzgerald is an 88 year old female with breast cancer who presents for follow-up regarding her treatment and recent CT scan findings.  The recent CT scan shows an increase in the size of a lymph node under her arm, from 1.6 cm to 2.4 cm. Swelling on her side has been present for about four weeks, likely related to lymphedema.  She is currently taking anastrozole , which has provided some control over her cancer. Skin changes associated with her breast cancer involve cancer cells affecting the skin surface, with potential for ulceration if untreated.  She manages her lymphedema with a compression sleeve during the day and lives near the physical therapy office.     ALLERGIES:  has no known allergies.  MEDICATIONS:  Current Outpatient Medications  Medication Sig Dispense Refill   acetaminophen  (TYLENOL ) 500 MG tablet Take 2 tablets (1,000 mg total) by mouth every 6 (six) hours as needed for mild pain (pain score 1-3) or moderate pain (pain score 4-6).     anastrozole  (ARIMIDEX ) 1 MG tablet Take 1 tablet (1 mg total) by mouth daily. 90 tablet 3   Calcium Carbonate-Vit D-Min (CALCIUM 1200 PO) Take 1 tablet by mouth daily.     cholecalciferol (VITAMIN D) 1000 units tablet Take 1,000 Units by mouth daily.  denosumab  (PROLIA ) 60 MG/ML SOSY injection Inject 60 mg into the skin every 6 (six) months.     folic acid  (FOLVITE ) 1 MG tablet Take 1 tablet (1 mg total) by mouth daily.     lisinopril  (PRINIVIL ,ZESTRIL ) 10 MG tablet Take 10 mg by mouth daily.     methotrexate (RHEUMATREX) 2.5 MG  tablet Take 17.5 mg by mouth once a week.     Multiple Vitamin (MULTIVITAMIN WITH MINERALS) TABS tablet Take 1 tablet by mouth daily.     Multiple Vitamins-Minerals (PRESERVISION AREDS 2+MULTI VIT) CAPS Take 1 capsule by mouth daily.     omega-3 acid ethyl esters (LOVAZA) 1 g capsule Take 1 g by mouth daily.     pantoprazole  (PROTONIX ) 40 MG tablet Take 1 tablet (40 mg total) by mouth 2 (two) times daily. 60 tablet 3   No current facility-administered medications for this visit.    PHYSICAL EXAMINATION: ECOG PERFORMANCE STATUS: 1 - Symptomatic but completely ambulatory  Vitals:   08/27/24 0842  BP: 138/80  Pulse: (!) 103  Resp: 20  Temp: 98.1 F (36.7 C)  SpO2: (!) 10%   Filed Weights   08/27/24 0842  Weight: 123 lb 3.2 oz (55.9 kg)    Physical Exam   (exam performed in the presence of a chaperone)  LABORATORY DATA:  I have reviewed the data as listed    Latest Ref Rng & Units 06/18/2024    5:37 AM 06/17/2024    3:40 AM 06/16/2024    3:31 AM  CMP  Glucose 70 - 99 mg/dL 96  89  99   BUN 8 - 23 mg/dL 11  10  19    Creatinine 0.44 - 1.00 mg/dL 9.18  9.23  9.05   Sodium 135 - 145 mmol/L 140  140  141   Potassium 3.5 - 5.1 mmol/L 4.0  4.0  4.1   Chloride 98 - 111 mmol/L 109  109  109   CO2 22 - 32 mmol/L 19  18  22    Calcium 8.9 - 10.3 mg/dL 8.8  8.7  8.8   Total Protein 6.5 - 8.1 g/dL   6.0   Total Bilirubin 0.0 - 1.2 mg/dL   0.3   Alkaline Phos 38 - 126 U/L   29   AST 15 - 41 U/L   28   ALT 0 - 44 U/L   18     Lab Results  Component Value Date   WBC 7.4 06/18/2024   HGB 10.6 (L) 06/18/2024   HCT 32.3 (L) 06/18/2024   MCV 93.6 06/18/2024   PLT 288 06/18/2024   NEUTROABS 5.4 06/18/2024    ASSESSMENT & PLAN:  Malignant neoplasm of overlapping sites of left breast in female, estrogen receptor positive (HCC) 12/05/2023:Patient presented with 2 palpable lumps in the left breast 10:00: 2.7 cm with skin tethering, 2:00: 4.2 cm, 3 masses in the left axilla possibly  lymph nodes.  Biopsy of the first mass was IDC grade 2, ER 95%, PR 70%, Ki67 20%, HER2 negative; biopsy of second mass grade 3 IDC ER 70%, PR 60%, Ki67 90%, HER2 negative, biopsy of the lymph node grade 3 IDC    12/20/2023: Breast MRI: Left breast: 5.3 cm mass UOQ abuts pectoralis muscle and skin, multiple satellite nodules and non-mass enhancement (entire area 8.8 cm), 3.7 cm irregular mass UIQ abuts pectoralis muscle and skin, 3 cm mass left axilla, mild diffuse skin thickening, 3 lymph nodes enlarged   12/25/2023: CT CAP: Left  breast masses and left axillary and subpectoral lymph nodes, multiple small bilateral pulmonary nodules highly suspicious for lung metastases (0.8 cm, 0.8 cm, 1 cm)   01/07/2024: Bone scan: Negative   Treatment plan: Neoadjuvant antiestrogen therapy with anastrozole  started April 2025, switched to letrozole with Verzinio because of worsening cutaneous metastases on 08/27/2024  ----------------------------------------------------------------------------------------- 01/20/2024: PET/CT: Hypermetabolic left breast cancer, soft tissue nodule abutting left pectoralis major is smaller, several hypermetabolic nodes left axilla and supraclavicular are slightly smaller, left upper mediastinal lymph node, small lung nodules some appear to be smaller 01/07/2024: Bone scan: Normal 05/05/24: Stable Left breast mass. Soft tissue mass left axilla/ lateral margin of pectoralis (similar to before), stable left lower paratracheal LN. lung nodules: Stable 06/05/2024: Mammogram and ultrasound: Decrease in the masses in the left breast and axilla was 4.2 cm is now 3 cm 08/23/2024: CT chest: Stable left breast masses and skin thickening, mild increase in the left axillary lymphadenopathy and new mild left supraclavicular lymphadenopathy, stable bilateral pulmonary nodules  Discussion: Based upon the recent CT findings it is concerning that there has been an increase in the left axillary lymphadenopathy  as well as a new left supraclavicular lymphadenopathy.  I recommend adding Verzinio and switching her from anastrozole  to letrozole.  Abemaciclib counseling: I discussed at length the risks and benefits of Abemaciclib in combination with letrozole. Adverse effects of Abemaciclib include decreasing neutrophil count, pneumonia, blood clots in lungs as well as nausea and GI symptoms. Side effects of letrozole include hot flashes, muscle aches and pains, uterine bleeding/spotting/cancer, osteoporosis, risk of blood clots.  Lymphedema of the left arm: PT referral  Return to clinic in 1 week for education with Norleen       No orders of the defined types were placed in this encounter.  The patient has a good understanding of the overall plan. she agrees with it. she will call with any problems that may develop before the next visit here.  I personally spent a total of 45 minutes in the care of the patient today including preparing to see the patient, getting/reviewing separately obtained history, performing a medically appropriate exam/evaluation, counseling and educating, placing orders, referring and communicating with other health care professionals, documenting clinical information in the EHR, independently interpreting results, communicating results, and coordinating care.   Viinay K Fayth Trefry, MD 08/27/24

## 2024-08-27 NOTE — Therapy (Signed)
 OUTPATIENT PHYSICAL THERAPY  UPPER EXTREMITY ONCOLOGY EVALUATION  Patient Name: Katelyn Fitzgerald MRN: [id number] DOB:[birth year], 88 y.o., female Today's Date: 08/27/2024  END OF SESSION:  PT End of Session - 08/27/24 2001     Visit Number 1    Number of Visits 12    Date for Recertification  10/08/24    PT Start Time 1400    PT Stop Time 1450    PT Time Calculation (min) 50 min    Activity Tolerance Patient tolerated treatment well    Behavior During Therapy [facility] for tasks assessed/performed          Past Medical History:  Diagnosis Date   Arthritis    Breast cancer (HCC)    Hypertension    Osteoporosis    Past Surgical History:  Procedure Laterality Date   BIOPSY OF SKIN SUBCUTANEOUS TISSUE AND/OR MUCOUS MEMBRANE  06/16/2024   Procedure: BIOPSY, SKIN, SUBCUTANEOUS TISSUE, OR MUCOUS MEMBRANE;  Surgeon: Rollin Dover, MD;  Location: [facility];  Service: Gastroenterology;;   BREAST BIOPSY Left 12/05/2023   US  LT BREAST BX W LOC DEV EA ADD LESION IMG BX SPEC US  GUIDE 12/05/2023 [facility]   BREAST BIOPSY Left 12/05/2023   US  LT BREAST BX W LOC DEV 1ST LESION IMG BX SPEC US  GUIDE 12/05/2023 [facility]   ESOPHAGOGASTRODUODENOSCOPY N/A 06/16/2024   Procedure: EGD (ESOPHAGOGASTRODUODENOSCOPY);  Surgeon: Rollin Dover, MD;  Location: [facility];  Service: Gastroenterology;  Laterality: N/A;   HEMOSTASIS CLIP PLACEMENT  06/16/2024   Procedure: CONTROL OF HEMORRHAGE, GI TRACT, ENDOSCOPIC, BY CLIPPING OR OVERSEWING;  Surgeon: Rollin Dover, MD;  Location: [facility];  Service: Gastroenterology;;   MATIAS  06/16/2024   Procedure: MATIAS;  Surgeon: Rollin Dover, MD;  Location: [facility];  Service: Gastroenterology;;   Patient Active Problem List   Diagnosis Date Noted   UGI bleed 06/15/2024   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   ABLA (acute blood loss anemia) 06/15/2024   Essential hypertension 06/01/2024   Osteoarthritis 06/01/2024   Osteoporosis  06/01/2024   Primary osteoarthritis involving multiple joints 06/01/2024   Stage 3a chronic kidney disease (HCC) 06/01/2024   Genetic testing 01/10/2024   Rheumatoid arthritis involving multiple sites (HCC) 12/11/2023   Methotrexate, long term, current use 12/11/2023   Malignant neoplasm of overlapping sites of left breast in female, estrogen receptor positive (HCC) 12/10/2023    PCP:   REFERRING PROVIDER: Dr. Mackey Chad  REFERRING DIAG: Left Breast Cancer/Left UE lymphedema  THERAPY DIAG:  Malignant neoplasm of overlapping sites of left female breast, unspecified estrogen receptor status (HCC)  Lymphedema, not elsewhere classified  ONSET DATE: 12/05/2023, swelling several weeks  Rationale for Evaluation and Treatment: Rehabilitation  SUBJECTIVE:  SUBJECTIVE STATEMENT:  Pts daughter Harland present for evaluation. Arm started swelling 2 weeks ago.  She has some skin involvement now under the arm.. At night her arm hurts and she rubs it lightly which decreases the pain before she can go to sleep. Pain is always worse at night  PERTINENT HISTORY:  12/05/2023:Patient presented with 2 palpable lumps in the left breast 10:00: 2.7 cm with skin tethering, 2:00: 4.2 cm, 3 masses in the left axilla possibly lymph nodes. Biopsy of the first mass was IDC grade 2, ER 95%, PR 70%, Ki67 20%, HER2 negative; biopsy of second mass grade 3 IDC ER 70%, PR 60%, Ki67 90%, HER2 negative, biopsy of the lymph node grade 3 IDC. She was placed on Anastrozole  in March with April PET Scan showing nodules were smaller, however, Recent CT 08/20/2024 shows an increase in the size of LN's under her arm and skin changes with potential for ulceration. Meds changed to Letrozole with Verzinio because of worsening cutaneous metastasis.  PAIN:   Are you having pain? Yes NPRS scale: 2/10-8/10 Pain location: left shoulder/and arm Pain orientation: Left  PAIN TYPE: tight and tender Pain description: constant and tender  Aggravating factors: night time, sleeping Relieving factors: massaging it,   PRECAUTIONS: OA, Breast Cancer (non surgical) affecting breast, LN's , hypertension, osteoporosis  RED FLAGS: None   WEIGHT BEARING RESTRICTIONS: No  FALLS:  Has patient fallen in last 6 months? Yes. Number of falls 1  LIVING ENVIRONMENT: Lives with: lives alone Lives in: House/apartment   OCCUPATION: NA  LEISURE: church, shopping, reading  HAND DOMINANCE: right   PRIOR LEVEL OF FUNCTION: Independent  PATIENT GOALS: Reduce swelling, reduce pain.   OBJECTIVE: Note: Objective measures were completed at Evaluation unless otherwise noted.  COGNITION: Overall cognitive status: Within functional limits for tasks assessed   PALPATION: +2 pitting edema hand, forearm, minimal in upper arm. Mild tenderness medial upper arm  OBSERVATIONS / OTHER ASSESSMENTS: left hand, forearm, and upper arm with significant swelling, orange peel, enlarged pores in forearm.  SENSATION: Light touch: Deficits    POSTURE: forward head, rounded shoulders  UPPER EXTREMITY AROM/PROM:  A/PROM RIGHT   eval   Shoulder extension   Shoulder flexion 138  Shoulder abduction 140  Shoulder internal rotation   Shoulder external rotation     (Blank rows = not tested)  A/PROM LEFT   eval  Shoulder extension   Shoulder flexion 115  Shoulder abduction 105  Shoulder internal rotation   Shoulder external rotation     (Blank rows = not tested)  CERVICAL AROM: All within fxl limits:     UPPER EXTREMITY STRENGTH:   LYMPHEDEMA ASSESSMENTS:   SURGERY TYPE/DATE: NA  NUMBER OF LYMPH NODES REMOVED: NA  CHEMOTHERAPY: NO  RADIATION:No  HORMONE TREATMENT: Letrozole, Versenio  INFECTIONS: NO   LYMPHEDEMA ASSESSMENTS:   LANDMARK RIGHT   eval  At axilla  28.1  15 cm proximal to the proximal aspect of the olecranon process 26  10 cm proximal to the proximal aspect of the olecranon process 24  Olecranon process 22.5  15 cm proximal to the proximal aspect of the ulnar styloid process 21.5  10 cm proximal to the proximal aspect of the ulnar styloid process 19  Just distal to the ulnar styloid process 14.7  Across hand at thumb web space 18.6  At base of 2nd digit 5.2  (Blank rows = not tested)  LANDMARK LEFT  eval  At axilla  30.7  15 cm proximal  to the proximal aspect of the olecranon process 30.4  10 cm proximal to the proximal aspect of the olecranon process 30.8  Olecranon process 27.3  15 cm proximal to the proximal aspect of the ulnar styloid process 25.4  10 cm proximal to  the proximal aspect of the ulnar styloid process 22.6  Just distal to the ulnar styloid process 16.3  Across hand at thumb web space 20.0  At base of 2nd digit 6.1  (Blank rows = not tested)  Chest circumference just inferior to the axillae:  Chest circumference at the largest point:     FUNCTIONAL TESTS:      Lymphedema Life Impact Scale: 36%                                                                                                                           TREATMENT DATE:  08/27/2024 Discussed bandaging to reduce arm, and showed compression bandage. Pt with many questions about how she will bathe and generally not happy with the idea. Explained that due to active cancer can't guarantee arm will reduce but we will try, and have on many occasions been able to get good reduction. Explained purpose of sleeve to maintain, vs bandaging to reduce and why we want to reduce first. After many questions about bathing showed pt and her daughter a velcro wrap and explained it can be worn day or night and will reduce also. That may really be the best option for the pt. from a functional standpoint. Applied TG soft size small to arm and hand and  wrapped fingers with mollelast. Pt will remove tomorrow when she wants to bathe and may reapply TG soft as long as fingers don't swell, and it is comfortable for her. Pt and her daughter verbalized understanding. Briefly discussed MLD,    PATIENT EDUCATION:  Education details: bandages vs sleeve, velcro garment vs bandages, Active CA and possible limitations with compression, LOS, treatment interventions Person educated: Patient and daughter Education method: Explanation Education comprehension: verbalized understanding  HOME EXERCISE PROGRAM:   ASSESSMENT:  CLINICAL IMPRESSION: Patient is a 88 y.o. female who was seen today for physical therapy evaluation and treatment for left UE lymphedema and left lateral trunk edema secondary to active cancer with LN and skin involvement. Pt with limitations in left shoulder AROM, Significant edema in left UE with +2 pitting edema and peau'dorange. Swelling noted from hand to axilla. Pt is currently on letrozole  and verzenio . Left breast is pulled laterally and there are palpaaable LN's and visible skin involvement. See photos in Media. Pt will benefit from skilled therapy for compression to the left UE with wraps or velcro wraps, and possible MLD after discussion with pt and family. She will also benefit from shoulder ROM activities.   OBJECTIVE IMPAIRMENTS: decreased activity tolerance, decreased knowledge of condition, decreased ROM, increased edema, impaired UE functional use, postural dysfunction, and pain.   ACTIVITY LIMITATIONS: lifting, sleeping, and reach over head  PARTICIPATION LIMITATIONS:  driving  PERSONAL FACTORS: Age, Time since onset of injury/illness/exacerbation, and Active Cancer are also affecting patient's functional outcome.   REHAB POTENTIAL: Good  CLINICAL DECISION MAKING: Evolving/moderate complexity  EVALUATION COMPLEXITY: Moderate  GOALS: Goals reviewed with patient? Yes  SHORT TERM GOALS=LONG TERM GOALS: Target  date: 10/08/2024  Pt will be independent with HEP for left shoulder ROM Baseline: Goal status: INITIAL  2.  Pt will have appropriate Compression to reduce/maintain swelling Baseline:  Goal status: INITIAL  3.  Pt will improve left shoulder AROM flex and abd by 15-20 degrees for improved reaching ability Baseline: 115, 105 Goal status: INITIAL  4.  Pt will have edema reduction by atleast 2 cm at 15 cm prox to olecranon Baseline:  Goal status: INITIAL  5.  Pt/daughter will be independent in self management of lymphedema Baseline:  Goal status: INITIAL   PLAN:  PT FREQUENCY: 2x/week  PT DURATION: 6 weeks  PLANNED INTERVENTIONS: 97164- PT Re-evaluation, 97110-Therapeutic exercises, 97530- Therapeutic activity, 97112- Neuromuscular re-education, 97535- Self Care, 02859- Manual therapy, 97760- Orthotic Initial, and [phone]- Orthotic/Prosthetic subsequent  PLAN FOR NEXT SESSION: does pt want to go ahead and order velcro wrap vs bandaging,(might be willing to pay out of pocket to get faster), could try bandaging for a few days while waiting for velcro prn,shoulder ROM, MLD??  Grayce JINNY Sheldon, PT 08/27/2024, 8:08 PM

## 2024-08-28 ENCOUNTER — Telehealth: Payer: Self-pay

## 2024-08-28 NOTE — Telephone Encounter (Signed)
 Oral Oncology Patient Advocate Encounter  Patient requests copay assistance for Verzenio . No grant funding is available at this time. A [employer]  PAP application has been sent to the patient via [employer] to fill out and sign.    Charlott Hamilton,  CPhT-Adv  she/her/hers [facility]  [facility] Pharmacy Technician Patient Advocate Specialist III [facility] Phone: [phone]  Fax: [phone] [email or web address]

## 2024-08-31 NOTE — Telephone Encounter (Signed)
 Oral Oncology Patient Advocate Encounter  Katelyn Fitzgerald PAP Application for Verzenio   has been received from the patient and placed in Dr. Gara Mailbox for signature.  Status: Pending Dr. Gara signature     Katelyn Fitzgerald,  CPhT-Adv  she/her/hers [facility]  [facility] Pharmacy Technician Patient Advocate Specialist III [facility] Phone: [phone]  Fax: [phone] [email or web address]

## 2024-09-01 ENCOUNTER — Encounter: Payer: Self-pay | Admitting: Rehabilitation

## 2024-09-01 ENCOUNTER — Ambulatory Visit: Payer: Medicare (Managed Care) | Admitting: Rehabilitation

## 2024-09-01 DIAGNOSIS — I890 Lymphedema, not elsewhere classified: Secondary | ICD-10-CM

## 2024-09-01 DIAGNOSIS — C50812 Malignant neoplasm of overlapping sites of left female breast: Secondary | ICD-10-CM

## 2024-09-01 NOTE — Telephone Encounter (Signed)
 Oral Oncology Patient Advocate Encounter  PAP Application for Verzenio  through [employer] has been faxed in with both patient and Dr Gara signatures.  Status:Pending approval      Charlott Hamilton,  CPhT-Adv  she/her/hers [facility]  [facility] Pharmacy Technician Patient Advocate Specialist III [facility] Phone: [phone]  Fax: [phone] [email or web address]

## 2024-09-01 NOTE — Progress Notes (Unsigned)
 [facility]        Telephone: [phone]?Fax: [phone]   Oncology Clinical Pharmacist Practitioner Initial Assessment   Katelyn Fitzgerald is a 88 y.o. female with a diagnosis of breast cancer. They were contacted today via in-person visit.  Indication/Regimen Abemaciclib  (Verzenio ) is being used appropriately for treatment of metastatic breast cancer by Dr. Vinay Gudena. The treatment goal is: Control.     Wt Readings from Last 1 Encounters:  08/27/24 123 lb 3.2 oz (55.9 kg)    Estimated body surface area is 1.55 meters squared as calculated from the following:   Height as of 08/27/24: 5' 1 (1.549 m).   Weight as of 08/27/24: 123 lb 3.2 oz (55.9 kg).  The dosing regimen is 100 mg by mouth every 12 hours on days 1 to 28 of a 28-day cycle. This is being given  in combination with letrozole  started 08/27/24. It is planned to continue until disease progression or unacceptable toxicity. Prescription dose and frequency assessed for appropriateness.  Patient has agreed to treatment which is documented in physician note on 08/27/24. Counseled patient on administration, dosing, side effects, monitoring, drug-food interactions, safe handling, storage, and disposal.  She is currently doing PAP application and will start abemaciclib  once received   Dose Modifications Dr. Gudena is starting at a reduced dose of 100 mg PO BID  Access Assessment [name] will be receiving abemaciclib  through [facility] Insurance Concerns: PAP Start date if known: TBD  Adherence Assessment Reviewed importance on keeping a med schedule and plan for any missed doses Barriers to adherence identified? No  Communication and Learning Assessment Primary learner: patient Barriers to learning: No barriers Preferred language: English Learning preferences: Listening   Allergies No Known Allergies  Vitals    08/27/2024    8:42 AM 06/18/2024    5:42 AM 06/17/2024    10:00 PM  Oncology Vitals  Height 155 cm    Weight 55.883 kg  54.1 kg  Weight (lbs) 123 lbs 3 oz  119 lbs 4 oz  BMI 23.28 kg/m2  22.54 kg/m2  Temp 98.1 F (36.7 C) 98.9 F (37.2 C) 98.7 F (37.1 C)  Pulse Rate 103 91 93  BP 138/80 140/64 156/56  Resp 20 14 16   SpO2 10 % 98 % 100 %  BSA (m2) 1.55 m2  1.53 m2     Laboratory Data    Latest Ref Rng & Units 06/18/2024    5:37 AM 06/17/2024    3:40 AM 06/16/2024    2:51 PM  CBC EXTENDED  WBC 4.0 - 10.5 K/uL 7.4  7.5    RBC 3.87 - 5.11 MIL/uL 3.45  3.17    Hemoglobin 12.0 - 15.0 g/dL 89.3  9.8  89.6   HCT 63.9 - 46.0 % 32.3  29.8  31.9   Platelets 150 - 400 K/uL 288  230    NEUT# 1.7 - 7.7 K/uL 5.4  5.7    Lymph# 0.7 - 4.0 K/uL 1.1  1.0         Latest Ref Rng & Units 06/18/2024    5:37 AM 06/17/2024    3:40 AM 06/16/2024    3:31 AM  CMP  Glucose 70 - 99 mg/dL 96  89  99   BUN 8 - 23 mg/dL 11  10  19    Creatinine 0.44 - 1.00 mg/dL 9.18  9.23  9.05   Sodium 135 - 145 mmol/L 140  140  141   Potassium  3.5 - 5.1 mmol/L 4.0  4.0  4.1   Chloride 98 - 111 mmol/L 109  109  109   CO2 22 - 32 mmol/L 19  18  22    Calcium 8.9 - 10.3 mg/dL 8.8  8.7  8.8   Total Protein 6.5 - 8.1 g/dL   6.0   Total Bilirubin 0.0 - 1.2 mg/dL   0.3   Alkaline Phos 38 - 126 U/L   29   AST 15 - 41 U/L   28   ALT 0 - 44 U/L   18    Lab Results  Component Value Date   MG 2.5 (H) 06/18/2024   MG 2.3 06/17/2024   Lab Results  Component Value Date   CA2729 <9.0 04/29/2024    Contraindications Contraindications were reviewed? Yes Contraindications to therapy were identified? No   Safety Precautions The following safety precautions for the use of abemaciclib  were reviewed:  Changes in kidney function: importance of drinking plenty of fluids and monitoring urine output Diarrhea: we reviewed that diarrhea is common with abemaciclib  and confirmed that she does have loperamide (Imodium) at home.  We reviewed how to take this medication PRN and gave her  information on abemaciclib  Decreased white blood cells (WBCs) and increased risk for infection: we discussed the importance of having a thermometer and what the Centers for Disease Control and Prevention (CDC) considers a fever which is 100.65F (38C) or higher.  Gave patient 24/7 triage line to call if any fevers or symptoms Decreased hemoglobin, part of red blood cells that carry iron and oxygen Fatigue Nausea and Vomiting Hepatotoxicity: reviewed to contact clinic for RUQ pain that will not subside, yellowing of eyes/skin Decreased appetite or weight loss Abdominal pain Decreased platelet count and increased risk for bleeding Venous thromboembolism (VTE): reviewed signs of deep vein thrombosis (DVT) such as leg swelling, redness, pain, or tenderness and signs of pulmonary embolism (PE) such as shortness of breath, rapid or irregular heartbeat, cough, chest pain, or lightheadedness ILD/Pneumonitis: we reviewed potential symptoms including cough, shortness, and fatigue. Handling body fluids and waste Pregnancy, sexual activity, and contraception Avoid grapefruit products Reviewed to take the medication every 12 hours (with food sometimes can be easier on the stomach) and to take it at the same time every day. Discussed proper storage and handling of abemaciclib  ***  Medication Reconciliation Current Outpatient Medications  Medication Sig Dispense Refill   abemaciclib  (VERZENIO ) 100 MG tablet Take 1 tablet (100 mg total) by mouth 2 (two) times daily. 60 tablet 3   acetaminophen  (TYLENOL ) 500 MG tablet Take 2 tablets (1,000 mg total) by mouth every 6 (six) hours as needed for mild pain (pain score 1-3) or moderate pain (pain score 4-6).     Calcium Carbonate-Vit D-Min (CALCIUM 1200 PO) Take 1 tablet by mouth daily.     cholecalciferol (VITAMIN D) 1000 units tablet Take 1,000 Units by mouth daily.     denosumab  (PROLIA ) 60 MG/ML SOSY injection Inject 60 mg into the skin every 6 (six) months.      folic acid  (FOLVITE ) 1 MG tablet Take 1 tablet (1 mg total) by mouth daily.     letrozole  (FEMARA ) 2.5 MG tablet Take 1 tablet (2.5 mg total) by mouth daily. 90 tablet 3   lisinopril  (PRINIVIL ,ZESTRIL ) 10 MG tablet Take 10 mg by mouth daily.     methotrexate (RHEUMATREX) 2.5 MG tablet Take 17.5 mg by mouth once a week.     Multiple Vitamin (MULTIVITAMIN WITH MINERALS)  TABS tablet Take 1 tablet by mouth daily.     Multiple Vitamins-Minerals (PRESERVISION AREDS 2+MULTI VIT) CAPS Take 1 capsule by mouth daily.     omega-3 acid ethyl esters (LOVAZA) 1 g capsule Take 1 g by mouth daily.     pantoprazole  (PROTONIX ) 40 MG tablet Take 1 tablet (40 mg total) by mouth 2 (two) times daily. 60 tablet 3   No current facility-administered medications for this visit.    Medication reconciliation is based on the patient's most recent medication list in the electronic medical record (EMR) including herbal products and OTC medications.   [name] medication list was reviewed today with the patient? Yes   Drug-drug interactions (DDIs) DDIs were evaluated? Yes Significant DDIs identified? No  Drug-Food Interactions Drug-food interactions were evaluated? Yes Drug-food interactions identified? Grapefruit products  Follow-up Plan  Patient education handout given to patient Start abemaciclib  100 mg by mouth every 12 hours. Start date once received from PAP Continue letrozole  2.5 mg by mouth daily Monitor for side effects Distress thermometer completed during {ksvisittype:33122} and reviewed with patient. Due to score, social work referral has {social work yes/no:33124} *** Claudeen Leason can follow up with clinical pharmacy as deemed necessary by Dr. Mackey Chad going forward   Ariellah Faust participated in the discussion, expressed understanding, and voiced agreement with the above plan. All questions were answered to her satisfaction. The patient was advised to contact the clinic at [phone]  with any questions or concerns prior to her return visit.   I spent 60 minutes assessing the patient.  Brilee Port A. Lucila, PharmD, BCOP, CPP  Norleen DELENA Lucila, RPH-CPP, 09/01/2024 8:28 AM  **Disclaimer: This note was dictated with voice recognition software. Similar sounding words can inadvertently be transcribed and this note may contain transcription errors which may not have been corrected upon publication of note.**

## 2024-09-01 NOTE — Therapy (Addendum)
 OUTPATIENT PHYSICAL THERAPY  UPPER EXTREMITY ONCOLOGY  Patient Name: Katelyn Fitzgerald MRN: [id number] DOB:29-Jul-1936, 88 y.o., female Today's Date: 09/01/2024  END OF SESSION:  PT End of Session - 09/01/24 0953     Visit Number 2    Number of Visits 12    Date for Recertification  10/08/24    PT Start Time 1000    PT Stop Time 1056    PT Time Calculation (min) 56 min    Activity Tolerance Patient tolerated treatment well    Behavior During Therapy [facility] for tasks assessed/performed          Past Medical History:  Diagnosis Date   Arthritis    Breast cancer (HCC)    Hypertension    Osteoporosis    Past Surgical History:  Procedure Laterality Date   BIOPSY OF SKIN SUBCUTANEOUS TISSUE AND/OR MUCOUS MEMBRANE  06/16/2024   Procedure: BIOPSY, SKIN, SUBCUTANEOUS TISSUE, OR MUCOUS MEMBRANE;  Surgeon: Rollin Dover, MD;  Location: [facility];  Service: Gastroenterology;;   BREAST BIOPSY Left 12/05/2023   US  LT BREAST BX W LOC DEV EA ADD LESION IMG BX SPEC US  GUIDE 12/05/2023 [facility]   BREAST BIOPSY Left 12/05/2023   US  LT BREAST BX W LOC DEV 1ST LESION IMG BX SPEC US  GUIDE 12/05/2023 [facility]   ESOPHAGOGASTRODUODENOSCOPY N/A 06/16/2024   Procedure: EGD (ESOPHAGOGASTRODUODENOSCOPY);  Surgeon: Rollin Dover, MD;  Location: [facility];  Service: Gastroenterology;  Laterality: N/A;   HEMOSTASIS CLIP PLACEMENT  06/16/2024   Procedure: CONTROL OF HEMORRHAGE, GI TRACT, ENDOSCOPIC, BY CLIPPING OR OVERSEWING;  Surgeon: Rollin Dover, MD;  Location: [facility];  Service: Gastroenterology;;   MATIAS  06/16/2024   Procedure: MATIAS;  Surgeon: Rollin Dover, MD;  Location: [facility];  Service: Gastroenterology;;   Patient Active Problem List   Diagnosis Date Noted   UGI bleed 06/15/2024   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   ABLA (acute blood loss anemia) 06/15/2024   Essential hypertension 06/01/2024   Osteoarthritis 06/01/2024   Osteoporosis 06/01/2024    Primary osteoarthritis involving multiple joints 06/01/2024   Stage 3a chronic kidney disease (HCC) 06/01/2024   Genetic testing 01/10/2024   Rheumatoid arthritis involving multiple sites (HCC) 12/11/2023   Methotrexate, long term, current use 12/11/2023   Malignant neoplasm of overlapping sites of left breast in female, estrogen receptor positive (HCC) 12/10/2023    PCP:   REFERRING PROVIDER: Dr. Mackey Chad  REFERRING DIAG: Left Breast Cancer/Left UE lymphedema  THERAPY DIAG:  Malignant neoplasm of overlapping sites of left female breast, unspecified estrogen receptor status (HCC)  Lymphedema, not elsewhere classified  ONSET DATE: 12/05/2023, swelling several weeks  Rationale for Evaluation and Treatment: Rehabilitation  SUBJECTIVE:  SUBJECTIVE STATEMENT:  Daughter states that she likes the tg soft. Pt would like to do massage.   EVAL: Pts daughter Katelyn Fitzgerald present for evaluation. Arm started swelling 2 weeks ago.  She has some skin involvement now under the arm.. At night her arm hurts and she rubs it lightly which decreases the pain before she can go to sleep. Pain is always worse at night  PERTINENT HISTORY:  12/05/2023:Patient presented with 2 palpable lumps in the left breast 10:00: 2.7 cm with skin tethering, 2:00: 4.2 cm, 3 masses in the left axilla possibly lymph nodes. Biopsy of the first mass was IDC grade 2, ER 95%, PR 70%, Ki67 20%, HER2 negative; biopsy of second mass grade 3 IDC ER 70%, PR 60%, Ki67 90%, HER2 negative, biopsy of the lymph node grade 3 IDC. She was placed on Anastrozole  in March with April PET Scan showing nodules were smaller, however, Recent CT 08/20/2024 shows an increase in the size of LN's under her arm and skin changes with potential for ulceration. Meds changed to  Letrozole  with Verzinio because of worsening cutaneous metastasis.  PAIN:  Are you having pain? Yes NPRS scale: 2/10-8/10 Pain location: left shoulder/and arm Pain orientation: Left  PAIN TYPE: tight and tender Pain description: constant and tender  Aggravating factors: night time, sleeping Relieving factors: massaging it,   PRECAUTIONS: OA, Breast Cancer (non surgical) affecting breast, LN's , hypertension, osteoporosis  RED FLAGS: None   WEIGHT BEARING RESTRICTIONS: No  FALLS:  Has patient fallen in last 6 months? Yes. Number of falls 1  LIVING ENVIRONMENT: Lives with: lives alone Lives in: House/apartment   OCCUPATION: NA  LEISURE: church, shopping, reading  HAND DOMINANCE: right   PRIOR LEVEL OF FUNCTION: Independent  PATIENT GOALS: Reduce swelling, reduce pain.   OBJECTIVE: Note: Objective measures were completed at Evaluation unless otherwise noted.  COGNITION: Overall cognitive status: Within functional limits for tasks assessed   PALPATION: +2 pitting edema hand, forearm, minimal in upper arm. Mild tenderness medial upper arm  OBSERVATIONS / OTHER ASSESSMENTS: left hand, forearm, and upper arm with significant swelling, orange peel, enlarged pores in forearm.  SENSATION: Light touch: Deficits    POSTURE: forward head, rounded shoulders  UPPER EXTREMITY AROM/PROM:  A/PROM RIGHT   eval   Shoulder extension   Shoulder flexion 138  Shoulder abduction 140  Shoulder internal rotation   Shoulder external rotation     (Blank rows = not tested)  A/PROM LEFT   eval  Shoulder extension   Shoulder flexion 115  Shoulder abduction 105  Shoulder internal rotation   Shoulder external rotation     (Blank rows = not tested)  CERVICAL AROM: All within fxl limits:     UPPER EXTREMITY STRENGTH:   LYMPHEDEMA ASSESSMENTS:   SURGERY TYPE/DATE: NA  NUMBER OF LYMPH NODES REMOVED: NA  CHEMOTHERAPY: NO  RADIATION:No  HORMONE TREATMENT: Letrozole ,  Versenio  INFECTIONS: NO   LYMPHEDEMA ASSESSMENTS:   LANDMARK RIGHT  eval  At axilla  28.1  15 cm proximal to the proximal aspect of the olecranon process 26  10 cm proximal to the proximal aspect of the olecranon process 24  Olecranon process 22.5  15 cm proximal to the proximal aspect of the ulnar styloid process 21.5  10 cm proximal to the proximal aspect of the ulnar styloid process 19  Just distal to the ulnar styloid process 14.7  Across hand at thumb web space 18.6  At base of 2nd digit 5.2  (Blank rows =  not tested)  LANDMARK LEFT  eval  At axilla  30.7  15 cm proximal to the proximal aspect of the olecranon process 30.4  10 cm proximal to the proximal aspect of the olecranon process 30.8  Olecranon process 27.3  15 cm proximal to the proximal aspect of the ulnar styloid process 25.4  10 cm proximal to  the proximal aspect of the ulnar styloid process 22.6  Just distal to the ulnar styloid process 16.3  Across hand at thumb web space 20.0  At base of 2nd digit 6.1  (Blank rows = not tested)  Chest circumference just inferior to the axillae:  Chest circumference at the largest point:     FUNCTIONAL TESTS:    Lymphedema Life Impact Scale: 36%                                                                                                                           TREATMENT DATE:  09/01/24 Orthotic fit: showed pt all options for compression including: tribute, velcro wraps, comfiwave, and bandaging.  Pt likes the option of the comfiwave the best.  Measured for sizing and pt is a small arm and glove in regular length.   Supine MLD to focus more on the fibrosis in the arm then into sidelying posterior interaxillary work with edu on elevation, remedial exercises, massaging distal to proximal as pt had been doing proximal to distal.   PROM into flexion, abduction, ER/IR  08/27/2024 Discussed bandaging to reduce arm, and showed compression bandage. Pt with many  questions about how she will bathe and generally not happy with the idea. Explained that due to active cancer can't guarantee arm will reduce but we will try, and have on many occasions been able to get good reduction. Explained purpose of sleeve to maintain, vs bandaging to reduce and why we want to reduce first. After many questions about bathing showed pt and her daughter a velcro wrap and explained it can be worn day or night and will reduce also. That may really be the best option for the pt. from a functional standpoint. Applied TG soft size small to arm and hand and wrapped fingers with mollelast. Pt will remove tomorrow when she wants to bathe and may reapply TG soft as long as fingers don't swell, and it is comfortable for her. Pt and her daughter verbalized understanding. Briefly discussed MLD,    PATIENT EDUCATION:  Education details: bandages vs sleeve, velcro garment vs bandages, Active CA and possible limitations with compression, LOS, treatment interventions Person educated: Patient and daughter Education method: Explanation Education comprehension: verbalized understanding  HOME EXERCISE PROGRAM:   ASSESSMENT:  CLINICAL IMPRESSION: Pt did well with tg soft.  She prefers not to bandage.  Showed her various compression options and pt was interested in the comfiwave sleeve and glove.  We will order that through [facility] and use daughter's address and contact information.  Let them know that this was a more expensive garment  and if it ended up being too much we could make another choice.  Pt with +2 pitting edema in the hand to axilla with fibrosis noted along the ulnar border and the upper tricep region.  Discussed massaging in a more dough kneading motion on these firmer areas. Pt will benefit from a comfiwave garment due to disease status and burden in the axilla.  She requires something comfortable and soft that is a bit flexible.    OBJECTIVE IMPAIRMENTS: decreased activity tolerance,  decreased knowledge of condition, decreased ROM, increased edema, impaired UE functional use, postural dysfunction, and pain.   ACTIVITY LIMITATIONS: lifting, sleeping, and reach over head  PARTICIPATION LIMITATIONS: driving  PERSONAL FACTORS: Age, Time since onset of injury/illness/exacerbation, and Active Cancer are also affecting patient's functional outcome.   REHAB POTENTIAL: Good  CLINICAL DECISION MAKING: Evolving/moderate complexity  EVALUATION COMPLEXITY: Moderate  GOALS: Goals reviewed with patient? Yes  SHORT TERM GOALS=LONG TERM GOALS: Target date: 10/08/2024  Pt will be independent with HEP for left shoulder ROM Baseline: Goal status: INITIAL  2.  Pt will have appropriate Compression to reduce/maintain swelling Baseline:  Goal status: INITIAL  3.  Pt will improve left shoulder AROM flex and abd by 15-20 degrees for improved reaching ability Baseline: 115, 105 Goal status: INITIAL  4.  Pt will have edema reduction by atleast 2 cm at 15 cm prox to olecranon Baseline:  Goal status: INITIAL  5.  Pt/daughter will be independent in self management of lymphedema Baseline:  Goal status: INITIAL   PLAN:  PT FREQUENCY: 2x/week  PT DURATION: 6 weeks  PLANNED INTERVENTIONS: 97164- PT Re-evaluation, 97110-Therapeutic exercises, 97530- Therapeutic activity, 97112- Neuromuscular re-education, 97535- Self Care, [id number]- Manual therapy, 97760- Orthotic Initial, and [id number]- Orthotic/Prosthetic subsequent  PLAN FOR NEXT SESSION: cont tg soft and fingers until garments, shoulder ROM, MLD avoiding Lt axilla and chest wall.  *[employer] does not cover a comfiwave glove - sent them a note to see if the sleeve is covered.  The glove is like $350 so a velcro may need  to be a next best choice? - KT  Katelyn Fitzgerald, PT 09/01/2024, 11:04 AM

## 2024-09-02 ENCOUNTER — Inpatient Hospital Stay: Payer: Medicare (Managed Care)

## 2024-09-02 ENCOUNTER — Inpatient Hospital Stay: Payer: Medicare (Managed Care) | Admitting: Pharmacist

## 2024-09-02 VITALS — BP 142/68 | HR 83 | Temp 97.2°F | Resp 18 | Wt 122.4 lb

## 2024-09-02 DIAGNOSIS — C50812 Malignant neoplasm of overlapping sites of left female breast: Secondary | ICD-10-CM | POA: Diagnosis not present

## 2024-09-02 LAB — CMP (CANCER CENTER ONLY)
ALT: 14 U/L (ref 0–44)
AST: 30 U/L (ref 15–41)
Albumin: 4.2 g/dL (ref 3.5–5.0)
Alkaline Phosphatase: 48 U/L (ref 38–126)
Anion gap: 10 (ref 5–15)
BUN: 19 mg/dL (ref 8–23)
CO2: 28 mmol/L (ref 22–32)
Calcium: 10.6 mg/dL — ABNORMAL HIGH (ref 8.9–10.3)
Chloride: 101 mmol/L (ref 98–111)
Creatinine: 0.99 mg/dL (ref 0.44–1.00)
GFR, Estimated: 55 mL/min — ABNORMAL LOW (ref >60)
Glucose, Bld: 101 mg/dL — ABNORMAL HIGH (ref 70–99)
Potassium: 4.2 mmol/L (ref 3.5–5.1)
Sodium: 138 mmol/L (ref 135–145)
Total Bilirubin: 0.3 mg/dL (ref 0.0–1.2)
Total Protein: 8.2 g/dL — ABNORMAL HIGH (ref 6.5–8.1)

## 2024-09-02 LAB — CBC WITH DIFFERENTIAL (CANCER CENTER ONLY)
Abs Immature Granulocytes: 0.02 K/uL (ref 0.00–0.07)
Basophils Absolute: 0.1 K/uL (ref 0.0–0.1)
Basophils Relative: 1 %
Eosinophils Absolute: 0.1 K/uL (ref 0.0–0.5)
Eosinophils Relative: 1 %
HCT: 37 % (ref 36.0–46.0)
Hemoglobin: 12.1 g/dL (ref 12.0–15.0)
Immature Granulocytes: 0 %
Lymphocytes Relative: 22 %
Lymphs Abs: 1.7 K/uL (ref 0.7–4.0)
MCH: 28.9 pg (ref 26.0–34.0)
MCHC: 32.7 g/dL (ref 30.0–36.0)
MCV: 88.3 fL (ref 80.0–100.0)
Monocytes Absolute: 0.6 K/uL (ref 0.1–1.0)
Monocytes Relative: 8 %
Neutro Abs: 5.3 K/uL (ref 1.7–7.7)
Neutrophils Relative %: 68 %
Platelet Count: 311 K/uL (ref 150–400)
RBC: 4.19 MIL/uL (ref 3.87–5.11)
RDW: 14.1 % (ref 11.5–15.5)
WBC Count: 7.7 K/uL (ref 4.0–10.5)
nRBC: 0 % (ref 0.0–0.2)

## 2024-09-07 NOTE — Telephone Encounter (Signed)
 Oral Oncology Patient Advocate Encounter   Received notification that the application for assistance for Verzenio  through [employer] has been approved.   [employer] number [phone].   Effective dates: 09/07/2024 through 10/07/2025  Medication will be filled at [facility].  I have spoken to the patient's daughter Harland, she will be calling [facility] to set up  delivery   Charlott Hamilton,  CPhT-Adv  she/her/hers Elida  [facility] Pharmacy Technician Patient Advocate Specialist III [facility] Phone: [phone]  Fax: [phone] [email or web address]

## 2024-09-08 ENCOUNTER — Ambulatory Visit: Payer: Medicare (Managed Care)

## 2024-09-08 DIAGNOSIS — C50812 Malignant neoplasm of overlapping sites of left female breast: Secondary | ICD-10-CM | POA: Insufficient documentation

## 2024-09-08 DIAGNOSIS — I890 Lymphedema, not elsewhere classified: Secondary | ICD-10-CM | POA: Diagnosis not present

## 2024-09-08 NOTE — Therapy (Signed)
 OUTPATIENT PHYSICAL THERAPY  UPPER EXTREMITY ONCOLOGY  Patient Name: Katelyn Fitzgerald MRN: [id number] DOB:04/20/1936, 88 y.o., female Today's Date: 09/08/2024  END OF SESSION:  PT End of Session - 09/08/24 1601     Visit Number 3    Number of Visits 12    Date for Recertification  10/08/24    PT Start Time 1602    PT Stop Time 1655    PT Time Calculation (min) 53 min    Activity Tolerance Patient tolerated treatment well    Behavior During Therapy [facility] for tasks assessed/performed          Past Medical History:  Diagnosis Date   Arthritis    Breast cancer (HCC)    Hypertension    Osteoporosis    Past Surgical History:  Procedure Laterality Date   BIOPSY OF SKIN SUBCUTANEOUS TISSUE AND/OR MUCOUS MEMBRANE  06/16/2024   Procedure: BIOPSY, SKIN, SUBCUTANEOUS TISSUE, OR MUCOUS MEMBRANE;  Surgeon: Rollin Dover, MD;  Location: [facility];  Service: Gastroenterology;;   BREAST BIOPSY Left 12/05/2023   US  LT BREAST BX W LOC DEV EA ADD LESION IMG BX SPEC US  GUIDE 12/05/2023 [facility]   BREAST BIOPSY Left 12/05/2023   US  LT BREAST BX W LOC DEV 1ST LESION IMG BX SPEC US  GUIDE 12/05/2023 [facility]   ESOPHAGOGASTRODUODENOSCOPY N/A 06/16/2024   Procedure: EGD (ESOPHAGOGASTRODUODENOSCOPY);  Surgeon: Rollin Dover, MD;  Location: [facility];  Service: Gastroenterology;  Laterality: N/A;   HEMOSTASIS CLIP PLACEMENT  06/16/2024   Procedure: CONTROL OF HEMORRHAGE, GI TRACT, ENDOSCOPIC, BY CLIPPING OR OVERSEWING;  Surgeon: Rollin Dover, MD;  Location: [facility];  Service: Gastroenterology;;   MATIAS  06/16/2024   Procedure: MATIAS;  Surgeon: Rollin Dover, MD;  Location: [facility];  Service: Gastroenterology;;   Patient Active Problem List   Diagnosis Date Noted   UGI bleed 06/15/2024   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   ABLA (acute blood loss anemia) 06/15/2024   Essential hypertension 06/01/2024   Osteoarthritis 06/01/2024   Osteoporosis 06/01/2024    Primary osteoarthritis involving multiple joints 06/01/2024   Stage 3a chronic kidney disease (HCC) 06/01/2024   Genetic testing 01/10/2024   Rheumatoid arthritis involving multiple sites (HCC) 12/11/2023   Methotrexate, long term, current use 12/11/2023   Malignant neoplasm of overlapping sites of left breast in female, estrogen receptor positive (HCC) 12/10/2023    PCP:   REFERRING PROVIDER: Dr. Mackey Chad  REFERRING DIAG: Left Breast Cancer/Left UE lymphedema  THERAPY DIAG:  Malignant neoplasm of overlapping sites of left female breast, unspecified estrogen receptor status (HCC)  Lymphedema, not elsewhere classified  ONSET DATE: 12/05/2023, swelling several weeks  Rationale for Evaluation and Treatment: Rehabilitation  SUBJECTIVE:  SUBJECTIVE STATEMENT:   Felt pretty good after the massage technique last time.  EVAL: Pts daughter Harland present for evaluation. Arm started swelling 2 weeks ago.  She has some skin involvement now under the arm.. At night her arm hurts and she rubs it lightly which decreases the pain before she can go to sleep. Pain is always worse at night  PERTINENT HISTORY:  12/05/2023:Patient presented with 2 palpable lumps in the left breast 10:00: 2.7 cm with skin tethering, 2:00: 4.2 cm, 3 masses in the left axilla possibly lymph nodes. Biopsy of the first mass was IDC grade 2, ER 95%, PR 70%, Ki67 20%, HER2 negative; biopsy of second mass grade 3 IDC ER 70%, PR 60%, Ki67 90%, HER2 negative, biopsy of the lymph node grade 3 IDC. She was placed on Anastrozole  in March with April PET Scan showing nodules were smaller, however, Recent CT 08/20/2024 shows an increase in the size of LN's under her arm and skin changes with potential for ulceration. 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  PAIN:  Are you having pain? Yes NPRS scale: 3/10-6/10 Pain location: left shoulder/and arm Pain orientation: Left  PAIN TYPE: tight and tender Pain description: constant and tender  Aggravating factors: night time, sleeping Relieving factors: massaging it,   PRECAUTIONS: OA, Breast Cancer (non surgical) affecting breast, LN's , hypertension, osteoporosis  RED FLAGS: None   WEIGHT BEARING RESTRICTIONS: No  FALLS:  Has patient fallen in last 6 months? Yes. Number of falls 1  LIVING ENVIRONMENT: Lives with: lives alone Lives in: House/apartment   OCCUPATION: NA  LEISURE: church, shopping, reading  HAND DOMINANCE: right   PRIOR LEVEL OF FUNCTION: Independent  PATIENT GOALS: Reduce swelling, reduce pain.   OBJECTIVE: Note: Objective measures were completed at Evaluation unless otherwise noted.  COGNITION: Overall cognitive status: Within functional limits for tasks assessed   PALPATION: +2 pitting edema hand, forearm, minimal in upper arm. Mild tenderness medial upper arm  OBSERVATIONS / OTHER ASSESSMENTS: left hand, forearm, and upper arm with significant swelling, orange peel, enlarged pores in forearm.  SENSATION: Light touch: Deficits    POSTURE: forward head, rounded shoulders  UPPER EXTREMITY AROM/PROM:  A/PROM RIGHT   eval   Shoulder extension   Shoulder flexion 138  Shoulder abduction 140  Shoulder internal rotation   Shoulder external rotation     (Blank rows = not tested)  A/PROM LEFT   eval  Shoulder extension   Shoulder flexion 115  Shoulder abduction 105  Shoulder internal rotation   Shoulder external rotation     (Blank rows = not tested)  CERVICAL AROM: All within fxl limits:     UPPER EXTREMITY STRENGTH:   LYMPHEDEMA ASSESSMENTS:   SURGERY TYPE/DATE: NA  NUMBER OF LYMPH NODES REMOVED: NA  CHEMOTHERAPY: NO  RADIATION:No  HORMONE TREATMENT: Letrozole , Versenio  INFECTIONS:  NO   LYMPHEDEMA ASSESSMENTS:   LANDMARK RIGHT  eval  At axilla  28.1  15 cm proximal to the proximal aspect of the olecranon process 26  10 cm proximal to the proximal aspect of the olecranon process 24  Olecranon process 22.5  15 cm proximal to the proximal aspect of the ulnar styloid process 21.5  10 cm proximal to the proximal aspect of the ulnar styloid process 19  Just distal to the ulnar styloid process 14.7  Across hand at thumb web space 18.6  At base of 2nd digit 5.2  (Blank rows = not tested)  [facility]  LEFT  eval  At axilla  30.7  15 cm proximal to the proximal aspect of the olecranon process 30.4  10 cm proximal to the proximal aspect of the olecranon process 30.8  Olecranon process 27.3  15 cm proximal to the proximal aspect of the ulnar styloid process 25.4  10 cm proximal to  the proximal aspect of the ulnar styloid process 22.6  Just distal to the ulnar styloid process 16.3  Across hand at thumb web space 20.0  At base of 2nd digit 6.1  (Blank rows = not tested)  Chest circumference just inferior to the axillae:  Chest circumference at the largest point:     FUNCTIONAL TESTS:    Lymphedema Life Impact Scale: 36%                                                                                                                           TREATMENT DATE:   09/08/2024 Discussed cost of glove and pts daughter wants to pay for it. Found glove on compression guru cheaper and pts daughter ordered it today. Pt educated in clasped hands shoulder flexion and stargazer stretch x 5 reps ea.  Updated HEP with pictures of exs above. PROM left shoulder flexion, abduction, IR and ER with VC to pt to relax,  In supine:  5 diaphragmatic breaths, R axillary nodes, then L UE working proximal to distal, moving fluid from outer upper arm, then inner arm outwards, and outer arm upwards and  right SL to posterior inter-axillary pathway and then supine doing both sides of forearm moving  fluid  in Right SL to posterior inter-axillary pathway, then left hand repeating all steps spending extra time in any areas of fibrosis then retracing all steps and ending with right axillary LN's New TG soft cut for pts arm to allow fold at top so no rolling; pt prefers to wear it to wrist only as thumb hole was uncomfortable 09/01/24 Orthotic fit: showed pt all options for compression including: tribute, velcro wraps, comfiwave, and bandaging.  Pt likes the option of the comfiwave the best.  Measured for sizing and pt is a small arm and glove in regular length.   Supine MLD to focus more on the fibrosis in the arm then into sidelying posterior interaxillary work with edu on elevation, remedial exercises, massaging distal to proximal as pt had been doing proximal to distal.   PROM into flexion, abduction, ER/IR  08/27/2024 Discussed bandaging to reduce arm, and showed compression bandage. Pt with many questions about how she will bathe and generally not happy with the idea. Explained that due to active cancer can't guarantee arm will reduce but we will try, and have on many occasions been able to get good reduction. Explained purpose of sleeve to maintain, vs bandaging to reduce and why we want to reduce first. After many questions about bathing showed pt and her daughter a velcro wrap and explained it can be worn day or night  and will reduce also. That may really be the best option for the pt. from a functional standpoint. Applied TG soft size small to arm and hand and wrapped fingers with mollelast. Pt will remove tomorrow when she wants to bathe and may reapply TG soft as long as fingers don't swell, and it is comfortable for her. Pt and her daughter verbalized understanding. Briefly discussed MLD,    PATIENT EDUCATION:  Education details: bandages vs sleeve, velcro garment vs bandages, Active CA and possible limitations with compression, LOS, treatment interventions Person educated: Patient and  daughter Education method: Explanation Education comprehension: verbalized understanding  HOME EXERCISE PROGRAM:   ASSESSMENT:  CLINICAL IMPRESSION: Educated pt in some AAROM shoulder exercises and updated her HEP.  Continued MLD to pts left UE. She felt good after MLD. Reviewed proper direction again with pt. Pt is anxious to get her sleeve and glove.  OBJECTIVE IMPAIRMENTS: decreased activity tolerance, decreased knowledge of condition, decreased ROM, increased edema, impaired UE functional use, postural dysfunction, and pain.   ACTIVITY LIMITATIONS: lifting, sleeping, and reach over head  PARTICIPATION LIMITATIONS: driving  PERSONAL FACTORS: Age, Time since onset of injury/illness/exacerbation, and Active Cancer are also affecting patient's functional outcome.   REHAB POTENTIAL: Good  CLINICAL DECISION MAKING: Evolving/moderate complexity  EVALUATION COMPLEXITY: Moderate  GOALS: Goals reviewed with patient? Yes  SHORT TERM GOALS=LONG TERM GOALS: Target date: 10/08/2024  Pt will be independent with HEP for left shoulder ROM Baseline: Goal status: INITIAL  2.  Pt will have appropriate Compression to reduce/maintain swelling Baseline:  Goal status: INITIAL  3.  Pt will improve left shoulder AROM flex and abd by 15-20 degrees for improved reaching ability Baseline: 115, 105 Goal status: INITIAL  4.  Pt will have edema reduction by atleast 2 cm at 15 cm prox to olecranon Baseline:  Goal status: INITIAL  5.  Pt/daughter will be independent in self management of lymphedema Baseline:  Goal status: INITIAL   PLAN:  PT FREQUENCY: 2x/week  PT DURATION: 6 weeks  PLANNED INTERVENTIONS: [id number]- PT Re-evaluation, 97110-Therapeutic exercises, 97530- Therapeutic activity, 97112- Neuromuscular re-education, 97535- Self Care, [id number]- Manual therapy, 97760- Orthotic Initial, and [id number]- Orthotic/Prosthetic subsequent  PLAN FOR NEXT SESSION: cont tg soft and fingers until  garments, shoulder ROM, MLD avoiding Lt axilla and chest wall.  *[employer] does not cover a comfiwave glove - sent them a note to see if the sleeve is covered.   -Pts daughter ordered glove on compression guru.  Grayce JINNY Sheldon, PT 09/08/2024, 5:23 PM

## 2024-09-10 ENCOUNTER — Ambulatory Visit: Payer: Medicare (Managed Care)

## 2024-09-10 DIAGNOSIS — I890 Lymphedema, not elsewhere classified: Secondary | ICD-10-CM

## 2024-09-10 DIAGNOSIS — C50812 Malignant neoplasm of overlapping sites of left female breast: Secondary | ICD-10-CM | POA: Diagnosis not present

## 2024-09-10 NOTE — Therapy (Cosign Needed)
 OUTPATIENT PHYSICAL THERAPY  UPPER EXTREMITY ONCOLOGY  Patient Name: Katelyn Fitzgerald MRN: [id number] DOB:1936/01/23, 88 y.o., female Today's Date: 09/10/2024  END OF SESSION:  PT End of Session - 09/10/24 1500     Visit Number 4    Number of Visits 12    Date for Recertification  10/08/24    PT Start Time 1500    PT Stop Time 1549    PT Time Calculation (min) 49 min    Activity Tolerance Patient tolerated treatment well    Behavior During Therapy WFL for tasks assessed/performed           Past Medical History:  Diagnosis Date   Arthritis    Breast cancer (HCC)    Hypertension    Osteoporosis    Past Surgical History:  Procedure Laterality Date   BIOPSY OF SKIN SUBCUTANEOUS TISSUE AND/OR MUCOUS MEMBRANE  06/16/2024   Procedure: BIOPSY, SKIN, SUBCUTANEOUS TISSUE, OR MUCOUS MEMBRANE;  Surgeon: Rollin Dover, MD;  Location: [facility];  Service: Gastroenterology;;   BREAST BIOPSY Left 12/05/2023   US  LT BREAST BX W LOC DEV EA ADD LESION IMG BX SPEC US  GUIDE 12/05/2023 [facility]   BREAST BIOPSY Left 12/05/2023   US  LT BREAST BX W LOC DEV 1ST LESION IMG BX SPEC US  GUIDE 12/05/2023 [facility]   ESOPHAGOGASTRODUODENOSCOPY N/A 06/16/2024   Procedure: EGD (ESOPHAGOGASTRODUODENOSCOPY);  Surgeon: Rollin Dover, MD;  Location: [facility];  Service: Gastroenterology;  Laterality: N/A;   HEMOSTASIS CLIP PLACEMENT  06/16/2024   Procedure: CONTROL OF HEMORRHAGE, GI TRACT, ENDOSCOPIC, BY CLIPPING OR OVERSEWING;  Surgeon: Rollin Dover, MD;  Location: [facility];  Service: Gastroenterology;;   MATIAS  06/16/2024   Procedure: MATIAS;  Surgeon: Rollin Dover, MD;  Location: [facility];  Service: Gastroenterology;;   Patient Active Problem List   Diagnosis Date Noted   UGI bleed 06/15/2024   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   ABLA (acute blood loss anemia) 06/15/2024   Essential hypertension 06/01/2024   Osteoarthritis 06/01/2024   Osteoporosis 06/01/2024    Primary osteoarthritis involving multiple joints 06/01/2024   Stage 3a chronic kidney disease (HCC) 06/01/2024   Genetic testing 01/10/2024   Rheumatoid arthritis involving multiple sites (HCC) 12/11/2023   Methotrexate, long term, current use 12/11/2023   Malignant neoplasm of overlapping sites of left breast in female, estrogen receptor positive (HCC) 12/10/2023    PCP:   REFERRING PROVIDER: Dr. Mackey Chad  REFERRING DIAG: Left Breast Cancer/Left UE lymphedema  THERAPY DIAG:  Malignant neoplasm of overlapping sites of left female breast, unspecified estrogen receptor status (HCC)  Lymphedema, not elsewhere classified  ONSET DATE: 12/05/2023, swelling several weeks  Rationale for Evaluation and Treatment: Rehabilitation  SUBJECTIVE:  SUBJECTIVE STATEMENT:  Patient received her compression garment and wore it last night. She states that it feels good and is comfortable. Patient wore the compression sleeve over night and removed it first thing in the morning and wore the TG soft until her visit here. She has been compliant with her HEP.   EVAL: Pts daughter Katelyn Fitzgerald present for evaluation. Arm started swelling 2 weeks ago.  She has some skin involvement now under the arm.. At night her arm hurts and she rubs it lightly which decreases the pain before she can go to sleep. Pain is always worse at night  PERTINENT HISTORY:  12/05/2023:Patient presented with 2 palpable lumps in the left breast 10:00: 2.7 cm with skin tethering, 2:00: 4.2 cm, 3 masses in the left axilla possibly lymph nodes. Biopsy of the first mass was IDC grade 2, ER 95%, PR 70%, Ki67 20%, HER2 negative; biopsy of second mass grade 3 IDC ER 70%, PR 60%, Ki67 90%, HER2 negative, biopsy of the lymph node grade 3 IDC. She was placed on Anastrozole   in March with April PET Scan showing nodules were smaller, however, Recent CT 08/20/2024 shows an increase in the size of LN's under her arm and skin changes with potential for ulceration. 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  PAIN:  Are you having pain? Not currently  PRECAUTIONS: OA, Breast Cancer (non surgical) affecting breast, LN's , hypertension, osteoporosis  RED FLAGS: None   WEIGHT BEARING RESTRICTIONS: No  FALLS:  Has patient fallen in last 6 months? Yes. Number of falls 1  LIVING ENVIRONMENT: Lives with: lives alone Lives in: House/apartment   OCCUPATION: NA  LEISURE: church, shopping, reading  HAND DOMINANCE: right   PRIOR LEVEL OF FUNCTION: Independent  PATIENT GOALS: Reduce swelling, reduce pain.   OBJECTIVE: Note: Objective measures were completed at Evaluation unless otherwise noted.  COGNITION: Overall cognitive status: Within functional limits for tasks assessed   PALPATION: +2 pitting edema hand, forearm, minimal in upper arm. Mild tenderness medial upper arm  OBSERVATIONS / OTHER ASSESSMENTS: left hand, forearm, and upper arm with significant swelling, orange peel, enlarged pores in forearm.  SENSATION: Light touch: Deficits    POSTURE: forward head, rounded shoulders  UPPER EXTREMITY AROM/PROM:  A/PROM RIGHT   eval   Shoulder extension   Shoulder flexion 138  Shoulder abduction 140  Shoulder internal rotation   Shoulder external rotation     (Blank rows = not tested)  A/PROM LEFT   eval  Shoulder extension   Shoulder flexion 115  Shoulder abduction 105  Shoulder internal rotation   Shoulder external rotation     (Blank rows = not tested)  CERVICAL AROM: All within fxl limits:     UPPER EXTREMITY STRENGTH:   LYMPHEDEMA ASSESSMENTS:   SURGERY TYPE/DATE: NA  NUMBER OF LYMPH NODES REMOVED: NA  CHEMOTHERAPY: NO  RADIATION:No  HORMONE TREATMENT: Letrozole ,  Versenio  INFECTIONS: NO   LYMPHEDEMA ASSESSMENTS:   LANDMARK RIGHT  eval  At axilla  28.1  15 cm proximal to the proximal aspect of the olecranon process 26  10 cm proximal to the proximal aspect of the olecranon process 24  Olecranon process 22.5  15 cm proximal to the proximal aspect of the ulnar styloid process 21.5  10 cm proximal to the proximal aspect of the ulnar styloid process 19  Just distal to the ulnar styloid process 14.7  Across hand at thumb web space 18.6  At base of 2nd digit  5.2  (Blank rows = not tested)  LANDMARK LEFT  eval  At axilla  30.7  15 cm proximal to the proximal aspect of the olecranon process 30.4  10 cm proximal to the proximal aspect of the olecranon process 30.8  Olecranon process 27.3  15 cm proximal to the proximal aspect of the ulnar styloid process 25.4  10 cm proximal to  the proximal aspect of the ulnar styloid process 22.6  Just distal to the ulnar styloid process 16.3  Across hand at thumb web space 20.0  At base of 2nd digit 6.1  (Blank rows = not tested)  Chest circumference just inferior to the axillae:  Chest circumference at the largest point:     FUNCTIONAL TESTS:    Lymphedema Life Impact Scale: 36%                                                                                                                           TREATMENT DATE:  09/10/24 Therapeutic Exercise:  Patient & her daughter educated on the proper fit of the compression sleeve Observed patient don/doff compression sleeve independently Seated scap squeeze Supine shoulder AAROM with clasped hands x5 Supine stargazer stretch x 5 - mild increase in pain at end range Manual Therapy:  PROM left shoulder flexion, abduction, IR and ER - VC for pt to relax arm throughout  In supine: 5 diaphragmatic breaths, R axillary nodes, then L UE working proximal to distal, moving fluid from outer upper arm, then inner arm outwards, and outer arm upwards and right SL to  posterior inter-axillary pathway and then supine doing both sides of forearm moving fluid  in Right SL to posterior inter-axillary pathway, then left hand repeating all steps spending extra time in any areas of fibrosis then retracing all steps and ending with right axillary LN's   09/08/2024 Discussed cost of glove and pts daughter wants to pay for it. Found glove on compression guru cheaper and pts daughter ordered it today. Pt educated in clasped hands shoulder flexion and stargazer stretch x 5 reps ea.  Updated HEP with pictures of exs above. PROM left shoulder flexion, abduction, IR and ER with VC to pt to relax,  In supine:  5 diaphragmatic breaths, R axillary nodes, then L UE working proximal to distal, moving fluid from outer upper arm, then inner arm outwards, and outer arm upwards and right SL to posterior inter-axillary pathway and then supine doing both sides of forearm moving fluid  in Right SL to posterior inter-axillary pathway, then left hand repeating all steps spending extra time in any areas of fibrosis then retracing all steps and ending with right axillary LN's New TG soft cut for pts arm to allow fold at top so no rolling; pt prefers to wear it to wrist only as thumb hole was uncomfortable  09/01/24 Orthotic fit: showed pt all options for compression including: tribute, velcro wraps, comfiwave, and bandaging.  Pt likes the option of the  comfiwave the best.  Measured for sizing and pt is a small arm and glove in regular length.   Supine MLD to focus more on the fibrosis in the arm then into sidelying posterior interaxillary work with edu on elevation, remedial exercises, massaging distal to proximal as pt had been doing proximal to distal.   PROM into flexion, abduction, ER/IR  08/27/2024 Discussed bandaging to reduce arm, and showed compression bandage. Pt with many questions about how she will bathe and generally not happy with the idea. Explained that due to active cancer can't  guarantee arm will reduce but we will try, and have on many occasions been able to get good reduction. Explained purpose of sleeve to maintain, vs bandaging to reduce and why we want to reduce first. After many questions about bathing showed pt and her daughter a velcro wrap and explained it can be worn day or night and will reduce also. That may really be the best option for the pt. from a functional standpoint. Applied TG soft size small to arm and hand and wrapped fingers with mollelast. Pt will remove tomorrow when she wants to bathe and may reapply TG soft as long as fingers don't swell, and it is comfortable for her. Pt and her daughter verbalized understanding. Briefly discussed MLD,    PATIENT EDUCATION:  Education details: bandages vs sleeve, velcro garment vs bandages, Active CA and possible limitations with compression, LOS, treatment interventions Person educated: Patient and daughter Education method: Explanation Education comprehension: verbalized understanding  HOME EXERCISE PROGRAM:   ASSESSMENT:  CLINICAL IMPRESSION: Patient received her comfy wave compression garment and wore it last night until early this morning. The patient is educated on the proper fit and how to tell which is the front/back. The patient is able to don/doff the garment independently at the beginning of the visit and requires minimal assistance at the end of the visit. She has not received the compression glove yet but her daughter did purchase it. The patient tolerates the continuation of manual therapy with verbal cueing required during PROM due to muscle guarding. The patient reports a mild increase in pain with the supine stargazer stretch however, it improves following 5 repetitions. The patient would benefit from continued skilled physical therapy to address the impairments listed below.   OBJECTIVE IMPAIRMENTS: decreased activity tolerance, decreased knowledge of condition, decreased ROM, increased  edema, impaired UE functional use, postural dysfunction, and pain.   ACTIVITY LIMITATIONS: lifting, sleeping, and reach over head  PARTICIPATION LIMITATIONS: driving  PERSONAL FACTORS: Age, Time since onset of injury/illness/exacerbation, and Active Cancer are also affecting patient's functional outcome.   REHAB POTENTIAL: Good  CLINICAL DECISION MAKING: Evolving/moderate complexity  EVALUATION COMPLEXITY: Moderate  GOALS: Goals reviewed with patient? Yes  SHORT TERM GOALS=LONG TERM GOALS: Target date: 10/08/2024  Pt will be independent with HEP for left shoulder ROM Baseline: Goal status: INITIAL  2.  Pt will have appropriate Compression to reduce/maintain swelling Baseline:  Goal status: INITIAL  3.  Pt will improve left shoulder AROM flex and abd by 15-20 degrees for improved reaching ability Baseline: 115, 105 Goal status: INITIAL  4.  Pt will have edema reduction by atleast 2 cm at 15 cm prox to olecranon Baseline:  Goal status: INITIAL  5.  Pt/daughter will be independent in self management of lymphedema Baseline:  Goal status: INITIAL   PLAN:  PT FREQUENCY: 2x/week  PT DURATION: 6 weeks  PLANNED INTERVENTIONS: [id number]- PT Re-evaluation, 97110-Therapeutic exercises, 97530- Therapeutic activity, [id number]- Neuromuscular  re-education, [id number]- Self Care, [id number]- Manual therapy, [id number]- Orthotic Initial, and [id number]- Orthotic/Prosthetic subsequent  PLAN FOR NEXT SESSION: Measure next,cont comfi wave/ TG soft. Did she get glove, shoulder ROM, MLD avoiding Lt axilla and chest wall, re-measure UE circumference   Randall Pack, SPT  09/10/2024, 3:57 PM  I have reviewed and concur with this student's documentation. Therapist was present throughout treatment.  Grayce JINNY Sheldon, PT 09/10/2024 4:01 PM  Grayce Sheldon, PT 09/10/24 3:59 PM

## 2024-09-14 ENCOUNTER — Ambulatory Visit: Payer: Medicare (Managed Care)

## 2024-09-14 DIAGNOSIS — I890 Lymphedema, not elsewhere classified: Secondary | ICD-10-CM

## 2024-09-14 DIAGNOSIS — C50812 Malignant neoplasm of overlapping sites of left female breast: Secondary | ICD-10-CM

## 2024-09-14 NOTE — Therapy (Signed)
 OUTPATIENT PHYSICAL THERAPY  UPPER EXTREMITY ONCOLOGY TREATMENT  Patient Name: Katelyn Fitzgerald MRN: [id number] DOB:02/07/1936, 88 y.o., female Today's Date: 09/14/2024  END OF SESSION:  PT End of Session - 09/14/24 1403     Visit Number 5    Number of Visits 12    Date for Recertification  10/08/24    PT Start Time 1401    PT Stop Time 1458    PT Time Calculation (min) 57 min    Activity Tolerance Patient tolerated treatment well    Behavior During Therapy [facility] for tasks assessed/performed           Past Medical History:  Diagnosis Date   Arthritis    Breast cancer (HCC)    Hypertension    Osteoporosis    Past Surgical History:  Procedure Laterality Date   BIOPSY OF SKIN SUBCUTANEOUS TISSUE AND/OR MUCOUS MEMBRANE  06/16/2024   Procedure: BIOPSY, SKIN, SUBCUTANEOUS TISSUE, OR MUCOUS MEMBRANE;  Surgeon: Rollin Dover, MD;  Location: [facility];  Service: Gastroenterology;;   BREAST BIOPSY Left 12/05/2023   US  LT BREAST BX W LOC DEV EA ADD LESION IMG BX SPEC US  GUIDE 12/05/2023 [facility]   BREAST BIOPSY Left 12/05/2023   US  LT BREAST BX W LOC DEV 1ST LESION IMG BX SPEC US  GUIDE 12/05/2023 [facility]   ESOPHAGOGASTRODUODENOSCOPY N/A 06/16/2024   Procedure: EGD (ESOPHAGOGASTRODUODENOSCOPY);  Surgeon: Rollin Dover, MD;  Location: [facility];  Service: Gastroenterology;  Laterality: N/A;   HEMOSTASIS CLIP PLACEMENT  06/16/2024   Procedure: CONTROL OF HEMORRHAGE, GI TRACT, ENDOSCOPIC, BY CLIPPING OR OVERSEWING;  Surgeon: Rollin Dover, MD;  Location: [facility];  Service: Gastroenterology;;   MATIAS  06/16/2024   Procedure: MATIAS;  Surgeon: Rollin Dover, MD;  Location: [facility];  Service: Gastroenterology;;   Patient Active Problem List   Diagnosis Date Noted   UGI bleed 06/15/2024   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   ABLA (acute blood loss anemia) 06/15/2024   Essential hypertension 06/01/2024   Osteoarthritis 06/01/2024   Osteoporosis  06/01/2024   Primary osteoarthritis involving multiple joints 06/01/2024   Stage 3a chronic kidney disease (HCC) 06/01/2024   Genetic testing 01/10/2024   Rheumatoid arthritis involving multiple sites (HCC) 12/11/2023   Methotrexate, long term, current use 12/11/2023   Malignant neoplasm of overlapping sites of left breast in female, estrogen receptor positive (HCC) 12/10/2023    PCP:   REFERRING PROVIDER: Dr. Mackey Chad  REFERRING DIAG: Left Breast Cancer/Left UE lymphedema  THERAPY DIAG:  Malignant neoplasm of overlapping sites of left female breast, unspecified estrogen receptor status (HCC)  Lymphedema, not elsewhere classified  ONSET DATE: 12/05/2023, swelling several weeks  Rationale for Evaluation and Treatment: Rehabilitation  SUBJECTIVE:  SUBJECTIVE STATEMENT:  I've been wearing the comfy wave sleeve and glove and it's a little tight but otherwise it is comfortable.   EVAL: Pts daughter Harland present for evaluation. Arm started swelling 2 weeks ago.  She has some skin involvement now under the arm.. At night her arm hurts and she rubs it lightly which decreases the pain before she can go to sleep. Pain is always worse at night  PERTINENT HISTORY:  12/05/2023:Patient presented with 2 palpable lumps in the left breast 10:00: 2.7 cm with skin tethering, 2:00: 4.2 cm, 3 masses in the left axilla possibly lymph nodes. Biopsy of the first mass was IDC grade 2, ER 95%, PR 70%, Ki67 20%, HER2 negative; biopsy of second mass grade 3 IDC ER 70%, PR 60%, Ki67 90%, HER2 negative, biopsy of the lymph node grade 3 IDC. She was placed on Anastrozole  in March with April PET Scan showing nodules were smaller, however, Recent CT 08/20/2024 shows an increase in the size of LN's under her arm and skin changes with  potential for ulceration. 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  PAIN:  Are you having pain? Not currently  PRECAUTIONS: OA, Breast Cancer (non surgical) affecting breast, LN's , hypertension, osteoporosis  RED FLAGS: None   WEIGHT BEARING RESTRICTIONS: No  FALLS:  Has patient fallen in last 6 months? Yes. Number of falls 1  LIVING ENVIRONMENT: Lives with: lives alone Lives in: House/apartment   OCCUPATION: NA  LEISURE: church, shopping, reading  HAND DOMINANCE: right   PRIOR LEVEL OF FUNCTION: Independent  PATIENT GOALS: Reduce swelling, reduce pain.   OBJECTIVE: Note: Objective measures were completed at Evaluation unless otherwise noted.  COGNITION: Overall cognitive status: Within functional limits for tasks assessed   PALPATION: +2 pitting edema hand, forearm, minimal in upper arm. Mild tenderness medial upper arm  OBSERVATIONS / OTHER ASSESSMENTS: left hand, forearm, and upper arm with significant swelling, orange peel, enlarged pores in forearm.  SENSATION: Light touch: Deficits    POSTURE: forward head, rounded shoulders  UPPER EXTREMITY AROM/PROM:  A/PROM RIGHT   eval   Shoulder extension   Shoulder flexion 138  Shoulder abduction 140  Shoulder internal rotation   Shoulder external rotation     (Blank rows = not tested)  A/PROM LEFT   eval  Shoulder extension   Shoulder flexion 115  Shoulder abduction 105  Shoulder internal rotation   Shoulder external rotation     (Blank rows = not tested)  CERVICAL AROM: All within fxl limits:     UPPER EXTREMITY STRENGTH:   LYMPHEDEMA ASSESSMENTS:   SURGERY TYPE/DATE: NA  NUMBER OF LYMPH NODES REMOVED: NA  CHEMOTHERAPY: NO  RADIATION:No  HORMONE TREATMENT: Letrozole , Versenio  INFECTIONS: NO   LYMPHEDEMA ASSESSMENTS:   LANDMARK RIGHT  eval  At axilla  28.1  15 cm proximal to the proximal aspect of the olecranon process 26  10 cm  proximal to the proximal aspect of the olecranon process 24  Olecranon process 22.5  15 cm proximal to the proximal aspect of the ulnar styloid process 21.5  10 cm proximal to the proximal aspect of the ulnar styloid process 19  Just distal to the ulnar styloid process 14.7  Across hand at thumb web space 18.6  At base of 2nd digit 5.2  (Blank rows = not tested)  LANDMARK LEFT  eval Left 09/14/24  At axilla  30.7 31.3  15 cm proximal to the proximal aspect of the olecranon  process 30.4 27.7  10 cm proximal to the proximal aspect of the olecranon process 30.8 28.7  Olecranon process 27.3 24  15  cm proximal to the proximal aspect of the ulnar styloid process 25.4 23.9  10 cm proximal to  the proximal aspect of the ulnar styloid process 22.6 21.4  Just distal to the ulnar styloid process 16.3 15.5  Across hand at thumb web space 20.0 18.1  At base of 2nd digit 6.1 5.4  (Blank rows = not tested)  Chest circumference just inferior to the axillae:  Chest circumference at the largest point:     FUNCTIONAL TESTS:    Lymphedema Life Impact Scale: 36%                                                                                                                           TREATMENT DATE:  09/14/24: Therapeutic Exercises Pulleys into flex and abd x 2 mins each with cuing throughout to try to relax Lt shoulder/decrease scapular compensations as able Roll yellow ball up wall into flex and Lt scaption x 10 each  Doorway Pectoralis Stretch x 5 reps, 5-10 sec holds with cues to hold stretch and adjust height of arms until stretch is comfortable, not painful Manual Therapy:  Re measured circufmerence PROM left shoulder flexion, abduction, IR and ER - VC for pt to relax arm throughout  In supine: 5 diaphragmatic breaths, Rt axillary and pectoral nodes, then Lt UE working proximal to distal, moving fluid from lateral upper arm, then medial to lateral, and lateral arm upwards and doing both sides of  forearm moving fluid then left hand repeating all steps spending extra time in any areas of fibrosis then retracing all steps and ending with right axillary LN's.   09/10/24 Therapeutic Exercise:  Patient & her daughter educated on the proper fit of the compression sleeve Observed patient don/doff compression sleeve independently Seated scap squeeze Supine shoulder AAROM with clasped hands x5 Supine stargazer stretch x 5 - mild increase in pain at end range Manual Therapy:  PROM left shoulder flexion, abduction, IR and ER - VC for pt to relax arm throughout  In supine: 5 diaphragmatic breaths, R axillary nodes, then L UE working proximal to distal, moving fluid from outer upper arm, then inner arm outwards, and outer arm upwards and right SL to posterior inter-axillary pathway and then supine doing both sides of forearm moving fluid  in Right SL to posterior inter-axillary pathway, then left hand repeating all steps spending extra time in any areas of fibrosis then retracing all steps and ending with right axillary LN's   09/08/2024 Discussed cost of glove and pts daughter wants to pay for it. Found glove on compression guru cheaper and pts daughter ordered it today. Pt educated in clasped hands shoulder flexion and stargazer stretch x 5 reps ea.  Updated HEP with pictures of exs above. PROM left shoulder flexion, abduction, IR and ER with VC to pt to relax,  In supine:  5 diaphragmatic breaths, R axillary nodes, then L UE working proximal to distal, moving fluid from outer upper arm, then inner arm outwards, and outer arm upwards and right SL to posterior inter-axillary pathway and then supine doing both sides of forearm moving fluid  in Right SL to posterior inter-axillary pathway, then left hand repeating all steps spending extra time in any areas of fibrosis then retracing all steps and ending with right axillary LN's New TG soft cut for pts arm to allow fold at top so no rolling; pt prefers to  wear it to wrist only as thumb hole was uncomfortable     PATIENT EDUCATION:  Education details: bandages vs sleeve, velcro garment vs bandages, Active CA and possible limitations with compression, LOS, treatment interventions Person educated: Patient and daughter Education method: Explanation Education comprehension: verbalized understanding  HOME EXERCISE PROGRAM:   ASSESSMENT:  CLINICAL IMPRESSION: Pt comes in wearing her new comfy wave sleeve and glove and reports though a bit tight, she has been wearing this successfully and consistently. Today progressed pt to include AA/ROM of Lt shoulder to her tolerance with pulleys and ball roll up wall. Her circumference measurements have reduced well since wearing her new compression garments.   OBJECTIVE IMPAIRMENTS: decreased activity tolerance, decreased knowledge of condition, decreased ROM, increased edema, impaired UE functional use, postural dysfunction, and pain.   ACTIVITY LIMITATIONS: lifting, sleeping, and reach over head  PARTICIPATION LIMITATIONS: driving  PERSONAL FACTORS: Age, Time since onset of injury/illness/exacerbation, and Active Cancer are also affecting patient's functional outcome.   REHAB POTENTIAL: Good  CLINICAL DECISION MAKING: Evolving/moderate complexity  EVALUATION COMPLEXITY: Moderate  GOALS: Goals reviewed with patient? Yes  SHORT TERM GOALS=LONG TERM GOALS: Target date: 10/08/2024  Pt will be independent with HEP for left shoulder ROM Baseline: Goal status: INITIAL  2.  Pt will have appropriate Compression to reduce/maintain swelling Baseline:  Goal status: INITIAL  3.  Pt will improve left shoulder AROM flex and abd by 15-20 degrees for improved reaching ability Baseline: 115, 105 Goal status: INITIAL  4.  Pt will have edema reduction by atleast 2 cm at 15 cm prox to olecranon Baseline:  Goal status: INITIAL  5.  Pt/daughter will be independent in self management of  lymphedema Baseline:  Goal status: INITIAL   PLAN:  PT FREQUENCY: 2x/week  PT DURATION: 6 weeks  PLANNED INTERVENTIONS: 97164- PT Re-evaluation, 97110-Therapeutic exercises, 97530- Therapeutic activity, 97112- Neuromuscular re-education, 97535- Self Care, [id number]- Manual therapy, 97760- Orthotic Initial, and [id number]- Orthotic/Prosthetic subsequent  PLAN FOR NEXT SESSION: Pt to wear comfy wave garment in lieu of bandaging; shoulder ROM, MLD avoiding Lt axilla and chest wall, re-measure UE circumference weekly    Aden Berwyn Caldron, PTA 09/14/2024 4:43 PM

## 2024-09-17 ENCOUNTER — Ambulatory Visit: Payer: Medicare (Managed Care) | Admitting: Rehabilitation

## 2024-09-17 ENCOUNTER — Encounter: Payer: Self-pay | Admitting: Rehabilitation

## 2024-09-17 DIAGNOSIS — C50812 Malignant neoplasm of overlapping sites of left female breast: Secondary | ICD-10-CM | POA: Diagnosis not present

## 2024-09-17 DIAGNOSIS — I890 Lymphedema, not elsewhere classified: Secondary | ICD-10-CM

## 2024-09-17 NOTE — Patient Instructions (Signed)
 Deep Effective Breath   1.)  Standing, sitting, or laying down, place both hands on the belly. Take a deep breath IN, expanding the belly; then breath OUT, contracting the belly.   [email or web address].us [email or web address]    Axilla to Axilla - Sweep   2.) On Rt side make 5 circles in the armpit   Axilla to Inguinal Nodes - Sweep   3.) On involved side, make 5 circles at groin at panty line,   Arm Posterior: Elbow to Shoulder - Sweep   4.) Pump _5__ times from back of elbow to top of shoulder.   5.) Then inner  arm _5_ times     6.) Pump or stationary circles _5__ times from wrist to elbow making sure to do both sides of the forearm.     7.)Pump or stationary circles _5__ times on back of hand including knuckle spaces and individual fingers if needed working up towards the wrist,   8.)then retrace all your steps working back up the forearm, doing both sides; upper outer arm and back to your pathways _2-3_ times each. Then do 5 circles again at uninvolved armpit and involved groin where you started! Good job!!   Copyright  [employer]. All rights reserved.

## 2024-09-17 NOTE — Therapy (Signed)
 OUTPATIENT PHYSICAL THERAPY  UPPER EXTREMITY ONCOLOGY TREATMENT  Patient Name: Katelyn Fitzgerald MRN: [id number] DOB:16-Apr-1936, 88 y.o., female Today's Date: 09/17/2024  END OF SESSION:  PT End of Session - 09/17/24 1556     Visit Number 6    Number of Visits 12    Date for Recertification  10/08/24    PT Start Time 1600    PT Stop Time 1653    PT Time Calculation (min) 53 min    Activity Tolerance Patient tolerated treatment well    Behavior During Therapy [facility] for tasks assessed/performed           Past Medical History:  Diagnosis Date   Arthritis    Breast cancer (HCC)    Hypertension    Osteoporosis    Past Surgical History:  Procedure Laterality Date   BIOPSY OF SKIN SUBCUTANEOUS TISSUE AND/OR MUCOUS MEMBRANE  06/16/2024   Procedure: BIOPSY, SKIN, SUBCUTANEOUS TISSUE, OR MUCOUS MEMBRANE;  Surgeon: Rollin Dover, MD;  Location: [facility];  Service: Gastroenterology;;   BREAST BIOPSY Left 12/05/2023   US  LT BREAST BX W LOC DEV EA ADD LESION IMG BX SPEC US  GUIDE 12/05/2023 [facility]   BREAST BIOPSY Left 12/05/2023   US  LT BREAST BX W LOC DEV 1ST LESION IMG BX SPEC US  GUIDE 12/05/2023 [facility]   ESOPHAGOGASTRODUODENOSCOPY N/A 06/16/2024   Procedure: EGD (ESOPHAGOGASTRODUODENOSCOPY);  Surgeon: Rollin Dover, MD;  Location: [facility];  Service: Gastroenterology;  Laterality: N/A;   HEMOSTASIS CLIP PLACEMENT  06/16/2024   Procedure: CONTROL OF HEMORRHAGE, GI TRACT, ENDOSCOPIC, BY CLIPPING OR OVERSEWING;  Surgeon: Rollin Dover, MD;  Location: [facility];  Service: Gastroenterology;;   MATIAS  06/16/2024   Procedure: MATIAS;  Surgeon: Rollin Dover, MD;  Location: [facility];  Service: Gastroenterology;;   Patient Active Problem List   Diagnosis Date Noted   UGI bleed 06/15/2024   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   ABLA (acute blood loss anemia) 06/15/2024   Essential hypertension 06/01/2024   Osteoarthritis 06/01/2024   Osteoporosis  06/01/2024   Primary osteoarthritis involving multiple joints 06/01/2024   Stage 3a chronic kidney disease (HCC) 06/01/2024   Genetic testing 01/10/2024   Rheumatoid arthritis involving multiple sites (HCC) 12/11/2023   Methotrexate, long term, current use 12/11/2023   Malignant neoplasm of overlapping sites of left breast in female, estrogen receptor positive (HCC) 12/10/2023    PCP:   REFERRING PROVIDER: Dr. Mackey Chad  REFERRING DIAG: Left Breast Cancer/Left UE lymphedema  THERAPY DIAG:  Malignant neoplasm of overlapping sites of left female breast, unspecified estrogen receptor status (HCC)  Lymphedema, not elsewhere classified  ONSET DATE: 12/05/2023, swelling several weeks  Rationale for Evaluation and Treatment: Rehabilitation  SUBJECTIVE:  SUBJECTIVE STATEMENT:  It is a bit tight in the breast. No arm complaints   EVAL: Pts daughter Harland present for evaluation. Arm started swelling 2 weeks ago.  She has some skin involvement now under the arm.. At night her arm hurts and she rubs it lightly which decreases the pain before she can go to sleep. Pain is always worse at night  PERTINENT HISTORY:  12/05/2023:Patient presented with 2 palpable lumps in the left breast 10:00: 2.7 cm with skin tethering, 2:00: 4.2 cm, 3 masses in the left axilla possibly lymph nodes. Biopsy of the first mass was IDC grade 2, ER 95%, PR 70%, Ki67 20%, HER2 negative; biopsy of second mass grade 3 IDC ER 70%, PR 60%, Ki67 90%, HER2 negative, biopsy of the lymph node grade 3 IDC. She was placed on Anastrozole  in March with April PET Scan showing nodules were smaller, however, Recent CT 08/20/2024 shows an increase in the size of LN's under her arm and skin changes with potential for ulceration. 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  PAIN:  Are you having pain? Not currently  PRECAUTIONS: OA, Breast Cancer (non surgical) affecting breast, LN's , hypertension, osteoporosis  RED FLAGS: None   WEIGHT BEARING RESTRICTIONS: No  FALLS:  Has patient fallen in last 6 months? Yes. Number of falls 1  LIVING ENVIRONMENT: Lives with: lives alone Lives in: House/apartment   OCCUPATION: NA  LEISURE: church, shopping, reading  HAND DOMINANCE: right   PRIOR LEVEL OF FUNCTION: Independent  PATIENT GOALS: Reduce swelling, reduce pain.   OBJECTIVE: Note: Objective measures were completed at Evaluation unless otherwise noted.  COGNITION: Overall cognitive status: Within functional limits for tasks assessed   PALPATION: +2 pitting edema hand, forearm, minimal in upper arm. Mild tenderness medial upper arm  OBSERVATIONS / OTHER ASSESSMENTS: left hand, forearm, and upper arm with significant swelling, orange peel, enlarged pores in forearm.  SENSATION: Light touch: Deficits    POSTURE: forward head, rounded shoulders  UPPER EXTREMITY AROM/PROM:  A/PROM RIGHT   eval   Shoulder extension   Shoulder flexion 138  Shoulder abduction 140  Shoulder internal rotation   Shoulder external rotation     (Blank rows = not tested)  A/PROM LEFT   eval  Shoulder extension   Shoulder flexion 115  Shoulder abduction 105  Shoulder internal rotation   Shoulder external rotation     (Blank rows = not tested)  CERVICAL AROM: All within fxl limits:     UPPER EXTREMITY STRENGTH:   LYMPHEDEMA ASSESSMENTS:   SURGERY TYPE/DATE: NA  NUMBER OF LYMPH NODES REMOVED: NA  CHEMOTHERAPY: NO  RADIATION:No  HORMONE TREATMENT: Letrozole , Versenio  INFECTIONS: NO   LYMPHEDEMA ASSESSMENTS:   LANDMARK RIGHT  eval  At axilla  28.1  15 cm proximal to the proximal aspect of the olecranon process 26  10 cm proximal to the proximal aspect of the olecranon process 24   Olecranon process 22.5  15 cm proximal to the proximal aspect of the ulnar styloid process 21.5  10 cm proximal to the proximal aspect of the ulnar styloid process 19  Just distal to the ulnar styloid process 14.7  Across hand at thumb web space 18.6  At base of 2nd digit 5.2  (Blank rows = not tested)  LANDMARK LEFT  eval Left 09/14/24  At axilla  30.7 31.3  15 cm proximal to the proximal aspect of the olecranon process 30.4 27.7  10 cm proximal to  the proximal aspect of the olecranon process 30.8 28.7  Olecranon process 27.3 24  15  cm proximal to the proximal aspect of the ulnar styloid process 25.4 23.9  10 cm proximal to  the proximal aspect of the ulnar styloid process 22.6 21.4  Just distal to the ulnar styloid process 16.3 15.5  Across hand at thumb web space 20.0 18.1  At base of 2nd digit 6.1 5.4  (Blank rows = not tested)  Chest circumference just inferior to the axillae:  Chest circumference at the largest point:     FUNCTIONAL TESTS:    Lymphedema Life Impact Scale: 36%                                                                                                                           TREATMENT DATE:  09/14/24: Therapeutic Exercises Pulleys into flex and abd x 2 mins each  Roll yellow ball up wall into flex and Lt scaption x 10 each  Doorway Pectoralis Stretch x 5 reps, 5-10 sec holds with cues to hold stretch and adjust height of arms until stretch is comfortable Manual Therapy:  PROM left shoulder flexion, abduction, IR and ER - VC for pt to relax arm throughout  In supine: 5 diaphragmatic breaths, Rt axillary and pectoral nodes, then Lt UE working proximal to distal, moving fluid from lateral upper arm, then medial to lateral, and lateral arm upwards and doing both sides of forearm moving fluid then left hand repeating all steps spending extra time in any areas of fibrosis then retracing all steps and ending with right axillary LN's.  Self Care:  Edu on  self MLD in seated. Abbreviated to: 5 deep breaths - unable to achieve this despite lengthy vc and demo from PT and daughter.   Rt axillary nodes, Lt inguinal nodes Lt UE (did not do medial to lateral due to ease) And then reversing all steps  09/10/24 Therapeutic Exercise:  Patient & her daughter educated on the proper fit of the compression sleeve Observed patient don/doff compression sleeve independently Seated scap squeeze Supine shoulder AAROM with clasped hands x5 Supine stargazer stretch x 5 - mild increase in pain at end range Manual Therapy:  PROM left shoulder flexion, abduction, IR and ER - VC for pt to relax arm throughout  In supine: 5 diaphragmatic breaths, R axillary nodes, then L UE working proximal to distal, moving fluid from outer upper arm, then inner arm outwards, and outer arm upwards and right SL to posterior inter-axillary pathway and then supine doing both sides of forearm moving fluid  in Right SL to posterior inter-axillary pathway, then left hand repeating all steps spending extra time in any areas of fibrosis then retracing all steps and ending with right axillary LN's   09/08/2024 Discussed cost of glove and pts daughter wants to pay for it. Found glove on compression guru cheaper and pts daughter ordered it today. Pt educated in clasped hands shoulder flexion and stargazer  stretch x 5 reps ea.  Updated HEP with pictures of exs above. PROM left shoulder flexion, abduction, IR and ER with VC to pt to relax,  In supine:  5 diaphragmatic breaths, R axillary nodes, then L UE working proximal to distal, moving fluid from outer upper arm, then inner arm outwards, and outer arm upwards and right SL to posterior inter-axillary pathway and then supine doing both sides of forearm moving fluid  in Right SL to posterior inter-axillary pathway, then left hand repeating all steps spending extra time in any areas of fibrosis then retracing all steps and ending with right axillary  LN's New TG soft cut for pts arm to allow fold at top so no rolling; pt prefers to wear it to wrist only as thumb hole was uncomfortable     PATIENT EDUCATION:  Education details: bandages vs sleeve, velcro garment vs bandages, Active CA and possible limitations with compression, LOS, treatment interventions Person educated: Patient and daughter Education method: Explanation Education comprehension: verbalized understanding  HOME EXERCISE PROGRAM:   ASSESSMENT:  CLINICAL IMPRESSION: Pt is doing well with AAROM and compression.  She had one small mark in the elbow today which looked like a very thin scratch and she was edu to keep an eye on this.  Self MLD was difficult even abbreviated especially the diaphragmatic breathing and returning demonstration.    OBJECTIVE IMPAIRMENTS: decreased activity tolerance, decreased knowledge of condition, decreased ROM, increased edema, impaired UE functional use, postural dysfunction, and pain.   ACTIVITY LIMITATIONS: lifting, sleeping, and reach over head  PARTICIPATION LIMITATIONS: driving  PERSONAL FACTORS: Age, Time since onset of injury/illness/exacerbation, and Active Cancer are also affecting patient's functional outcome.   REHAB POTENTIAL: Good  CLINICAL DECISION MAKING: Evolving/moderate complexity  EVALUATION COMPLEXITY: Moderate  GOALS: Goals reviewed with patient? Yes  SHORT TERM GOALS=LONG TERM GOALS: Target date: 10/08/2024  Pt will be independent with HEP for left shoulder ROM Baseline: Goal status: INITIAL  2.  Pt will have appropriate Compression to reduce/maintain swelling Baseline:  Goal status: INITIAL  3.  Pt will improve left shoulder AROM flex and abd by 15-20 degrees for improved reaching ability Baseline: 115, 105 Goal status: INITIAL  4.  Pt will have edema reduction by atleast 2 cm at 15 cm prox to olecranon Baseline:  Goal status: INITIAL  5.  Pt/daughter will be independent in self management of  lymphedema Baseline:  Goal status: INITIAL   PLAN:  PT FREQUENCY: 2x/week  PT DURATION: 6 weeks  PLANNED INTERVENTIONS: [id number]- PT Re-evaluation, 97110-Therapeutic exercises, 97530- Therapeutic activity, 97112- Neuromuscular re-education, 97535- Self Care, [id number]- Manual therapy, 97760- Orthotic Initial, and [id number]- Orthotic/Prosthetic subsequent  PLAN FOR NEXT SESSION: review self MLD.  Pt to wear comfy wave garment in lieu of bandaging; shoulder ROM, MLD avoiding Lt axilla and chest wall, re-measure UE circumference weekly    Larue Saddie SAUNDERS, PT 09/17/2024 4:56 PM

## 2024-09-22 ENCOUNTER — Ambulatory Visit: Payer: Medicare (Managed Care)

## 2024-09-22 DIAGNOSIS — C50812 Malignant neoplasm of overlapping sites of left female breast: Secondary | ICD-10-CM | POA: Diagnosis not present

## 2024-09-22 DIAGNOSIS — I890 Lymphedema, not elsewhere classified: Secondary | ICD-10-CM

## 2024-09-22 NOTE — Therapy (Signed)
 OUTPATIENT PHYSICAL THERAPY  UPPER EXTREMITY ONCOLOGY TREATMENT  Patient Name: Katelyn Fitzgerald MRN: [id number] DOB:07/24/1936, 88 y.o., female Today's Date: 09/22/2024  END OF SESSION:  PT End of Session - 09/22/24 1602     Visit Number 7    Number of Visits 12    Date for Recertification  10/08/24    PT Start Time 1603    PT Stop Time 1654    PT Time Calculation (min) 51 min    Activity Tolerance Patient tolerated treatment well    Behavior During Therapy [facility] for tasks assessed/performed           Past Medical History:  Diagnosis Date   Arthritis    Breast cancer (HCC)    Hypertension    Osteoporosis    Past Surgical History:  Procedure Laterality Date   BIOPSY OF SKIN SUBCUTANEOUS TISSUE AND/OR MUCOUS MEMBRANE  06/16/2024   Procedure: BIOPSY, SKIN, SUBCUTANEOUS TISSUE, OR MUCOUS MEMBRANE;  Surgeon: Rollin Dover, MD;  Location: [facility];  Service: Gastroenterology;;   BREAST BIOPSY Left 12/05/2023   US  LT BREAST BX W LOC DEV EA ADD LESION IMG BX SPEC US  GUIDE 12/05/2023 [facility]   BREAST BIOPSY Left 12/05/2023   US  LT BREAST BX W LOC DEV 1ST LESION IMG BX SPEC US  GUIDE 12/05/2023 [facility]   ESOPHAGOGASTRODUODENOSCOPY N/A 06/16/2024   Procedure: EGD (ESOPHAGOGASTRODUODENOSCOPY);  Surgeon: Rollin Dover, MD;  Location: [facility];  Service: Gastroenterology;  Laterality: N/A;   HEMOSTASIS CLIP PLACEMENT  06/16/2024   Procedure: CONTROL OF HEMORRHAGE, GI TRACT, ENDOSCOPIC, BY CLIPPING OR OVERSEWING;  Surgeon: Rollin Dover, MD;  Location: [facility];  Service: Gastroenterology;;   MATIAS  06/16/2024   Procedure: MATIAS;  Surgeon: Rollin Dover, MD;  Location: [facility];  Service: Gastroenterology;;   Patient Active Problem List   Diagnosis Date Noted   UGI bleed 06/15/2024   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   ABLA (acute blood loss anemia) 06/15/2024   Essential hypertension 06/01/2024   Osteoarthritis 06/01/2024   Osteoporosis  06/01/2024   Primary osteoarthritis involving multiple joints 06/01/2024   Stage 3a chronic kidney disease (HCC) 06/01/2024   Genetic testing 01/10/2024   Rheumatoid arthritis involving multiple sites (HCC) 12/11/2023   Methotrexate, long term, current use 12/11/2023   Malignant neoplasm of overlapping sites of left breast in female, estrogen receptor positive (HCC) 12/10/2023    PCP:   REFERRING PROVIDER: Dr. Mackey Chad  REFERRING DIAG: Left Breast Cancer/Left UE lymphedema  THERAPY DIAG:  Malignant neoplasm of overlapping sites of left female breast, unspecified estrogen receptor status (HCC)  Lymphedema, not elsewhere classified  ONSET DATE: 12/05/2023, swelling several weeks  Rationale for Evaluation and Treatment: Rehabilitation  SUBJECTIVE:  SUBJECTIVE STATEMENT:  The sleeve and glove I wear about half the day and then at night but I take it off when I wake up and put on the TG soft  EVAL: Pts daughter Harland present for evaluation. Arm started swelling 2 weeks ago.  She has some skin involvement now under the arm.. At night her arm hurts and she rubs it lightly which decreases the pain before she can go to sleep. Pain is always worse at night  PERTINENT HISTORY:  12/05/2023:Patient presented with 2 palpable lumps in the left breast 10:00: 2.7 cm with skin tethering, 2:00: 4.2 cm, 3 masses in the left axilla possibly lymph nodes. Biopsy of the first mass was IDC grade 2, ER 95%, PR 70%, Ki67 20%, HER2 negative; biopsy of second mass grade 3 IDC ER 70%, PR 60%, Ki67 90%, HER2 negative, biopsy of the lymph node grade 3 IDC. She was placed on Anastrozole  in March with April PET Scan showing nodules were smaller, however, Recent CT 08/20/2024 shows an increase in the size of LN's under her arm and skin  changes with potential for ulceration. 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  PAIN:  Are you having pain? Not currently  PRECAUTIONS: OA, Breast Cancer (non surgical) affecting breast, LN's , hypertension, osteoporosis  RED FLAGS: None   WEIGHT BEARING RESTRICTIONS: No  FALLS:  Has patient fallen in last 6 months? Yes. Number of falls 1  LIVING ENVIRONMENT: Lives with: lives alone Lives in: House/apartment   OCCUPATION: NA  LEISURE: church, shopping, reading  HAND DOMINANCE: right   PRIOR LEVEL OF FUNCTION: Independent  PATIENT GOALS: Reduce swelling, reduce pain.   OBJECTIVE: Note: Objective measures were completed at Evaluation unless otherwise noted.  COGNITION: Overall cognitive status: Within functional limits for tasks assessed   PALPATION: +2 pitting edema hand, forearm, minimal in upper arm. Mild tenderness medial upper arm  OBSERVATIONS / OTHER ASSESSMENTS: left hand, forearm, and upper arm with significant swelling, orange peel, enlarged pores in forearm.  SENSATION: Light touch: Deficits    POSTURE: forward head, rounded shoulders  UPPER EXTREMITY AROM/PROM:  A/PROM RIGHT   eval   Shoulder extension   Shoulder flexion 138  Shoulder abduction 140  Shoulder internal rotation   Shoulder external rotation     (Blank rows = not tested)  A/PROM LEFT   eval LEFT 09/22/2024  Shoulder extension    Shoulder flexion 115 126  Shoulder abduction 105 115 with scaption, 105 True  Shoulder internal rotation    Shoulder external rotation      (Blank rows = not tested)  CERVICAL AROM: All within fxl limits:     UPPER EXTREMITY STRENGTH:   LYMPHEDEMA ASSESSMENTS:   SURGERY TYPE/DATE: NA  NUMBER OF LYMPH NODES REMOVED: NA  CHEMOTHERAPY: NO  RADIATION:No  HORMONE TREATMENT: Letrozole , Versenio  INFECTIONS: NO   LYMPHEDEMA ASSESSMENTS:   LANDMARK RIGHT  eval  At axilla  28.1  15 cm  proximal to the proximal aspect of the olecranon process 26  10 cm proximal to the proximal aspect of the olecranon process 24  Olecranon process 22.5  15 cm proximal to the proximal aspect of the ulnar styloid process 21.5  10 cm proximal to the proximal aspect of the ulnar styloid process 19  Just distal to the ulnar styloid process 14.7  Across hand at thumb web space 18.6  At base of 2nd digit 5.2  (Blank rows = not tested)  LANDMARK LEFT  eval Left 09/14/24 LEFT 09/22/2024  At axilla  30.7 31.3   15 cm proximal to the proximal aspect of the olecranon process 30.4 27.7 28.6  10 cm proximal to the proximal aspect of the olecranon process 30.8 28.7 29.1  Olecranon process 27.3 24 24.5  15 cm proximal to the proximal aspect of the ulnar styloid process 25.4 23.9 24.1  10 cm proximal to  the proximal aspect of the ulnar styloid process 22.6 21.4 21.2  Just distal to the ulnar styloid process 16.3 15.5 15.2  Across hand at thumb web space 20.0 18.1 18  At base of 2nd digit 6.1 5.4 5.4  (Blank rows = not tested)  Chest circumference just inferior to the axillae:  Chest circumference at the largest point:     FUNCTIONAL TESTS:    Lymphedema Life Impact Scale: 36%                                                                                                                           TREATMENT DATE:   09/22/2024 Therapeutic exercises Supine AAROM flex x5 Stargazer x 5 Supine wand flex and scaption x 4 ea Manual Therapy: Measured circumference of left UE PROM left shoulder flexion, abduction, IR and ER - VC for pt to relax arm throughout  In supine: 5 diaphragmatic breaths, Rt axillary and pectoral nodes, then Lt UE working proximal to distal, moving fluid from lateral upper arm, then medial to lateral, and lateral arm upwards and doing both sides of forearm moving fluid then left hand repeating all steps spending extra time in any areas of fibrosis then retracing all steps and  ending with pt in SL with Posterior interaxillary anastomosis and  right axillary LN's.  Self Care:  Edu on self MLD in seated. Abbreviated to: 5 deep breaths - did well after practicing a couple of times  Rt axillary nodes, Lt inguinal nodes Lt UE (did not do medial to lateral due to ease) And then reversing all steps   09/14/24: Therapeutic Exercises Pulleys into flex and abd x 2 mins each  Roll yellow ball up wall into flex and Lt scaption x 10 each  Doorway Pectoralis Stretch x 5 reps, 5-10 sec holds with cues to hold stretch and adjust height of arms until stretch is comfortable Manual Therapy:  PROM left shoulder flexion, abduction, IR and ER - VC for pt to relax arm throughout  In supine: 5 diaphragmatic breaths, Rt axillary and pectoral nodes, then Lt UE working proximal to distal, moving fluid from lateral upper arm, then medial to lateral, and lateral arm upwards and doing both sides of forearm moving fluid then left hand repeating all steps spending extra time in any areas of fibrosis then retracing all steps and ending with right axillary LN's.  Self Care:  Edu on self MLD in seated. Abbreviated to: 5 deep breaths - unable to achieve this despite lengthy vc and demo from PT and daughter.  Rt axillary nodes, Lt inguinal nodes Lt UE (did not do medial to lateral due to ease) And then reversing all steps  09/10/24 Therapeutic Exercise:  Patient & her daughter educated on the proper fit of the compression sleeve Observed patient don/doff compression sleeve independently Seated scap squeeze Supine shoulder AAROM with clasped hands x5 Supine stargazer stretch x 5 - mild increase in pain at end range Manual Therapy:  PROM left shoulder flexion, abduction, IR and ER - VC for pt to relax arm throughout  In supine: 5 diaphragmatic breaths, R axillary nodes, then L UE working proximal to distal, moving fluid from outer upper arm, then inner arm outwards, and outer arm upwards and  right SL to posterior inter-axillary pathway and then supine doing both sides of forearm moving fluid  in Right SL to posterior inter-axillary pathway, then left hand repeating all steps spending extra time in any areas of fibrosis then retracing all steps and ending with right axillary LN's   09/08/2024 Discussed cost of glove and pts daughter wants to pay for it. Found glove on compression guru cheaper and pts daughter ordered it today. Pt educated in clasped hands shoulder flexion and stargazer stretch x 5 reps ea.  Updated HEP with pictures of exs above. PROM left shoulder flexion, abduction, IR and ER with VC to pt to relax,  In supine:  5 diaphragmatic breaths, R axillary nodes, then L UE working proximal to distal, moving fluid from outer upper arm, then inner arm outwards, and outer arm upwards and right SL to posterior inter-axillary pathway and then supine doing both sides of forearm moving fluid  in Right SL to posterior inter-axillary pathway, then left hand repeating all steps spending extra time in any areas of fibrosis then retracing all steps and ending with right axillary LN's New TG soft cut for pts arm to allow fold at top so no rolling; pt prefers to wear it to wrist only as thumb hole was uncomfortable     PATIENT EDUCATION:  Education details: bandages vs sleeve, velcro garment vs bandages, Active CA and possible limitations with compression, LOS, treatment interventions Person educated: Patient and daughter Education method: Explanation Education comprehension: verbalized understanding  HOME EXERCISE PROGRAM:   ASSESSMENT:  CLINICAL IMPRESSION: Pt is able to don sleeve independently and does better when folding in half to put on. 11 degree improvement in shoulder flexion ROM. Pt was improved today with diaphragmatic breathing and was able to relax her hand better after multiple VC's and assist to get more stretch than slide with MLD. Pt has 1 visit left. Will review all  and place on hold or DC  OBJECTIVE IMPAIRMENTS: decreased activity tolerance, decreased knowledge of condition, decreased ROM, increased edema, impaired UE functional use, postural dysfunction, and pain.   ACTIVITY LIMITATIONS: lifting, sleeping, and reach over head  PARTICIPATION LIMITATIONS: driving  PERSONAL FACTORS: Age, Time since onset of injury/illness/exacerbation, and Active Cancer are also affecting patient's functional outcome.   REHAB POTENTIAL: Good  CLINICAL DECISION MAKING: Evolving/moderate complexity  EVALUATION COMPLEXITY: Moderate  GOALS: Goals reviewed with patient? Yes  SHORT TERM GOALS=LONG TERM GOALS: Target date: 10/08/2024  Pt will be independent with HEP for left shoulder ROM Baseline: Goal status: INITIAL  2.  Pt will have appropriate Compression to reduce/maintain swelling Baseline:  Goal status: MET 09/22/2024 3.  Pt will improve left shoulder AROM flex and abd by 15-20 degrees for improved reaching ability Baseline: 115, 105 Goal status: INITIAL  4.  Pt will  have edema reduction by atleast 2 cm at 15 cm prox to olecranon Baseline:  Goal status: MET 09/14/2024  5.  Pt/daughter will be independent in self management of lymphedema Baseline:  Goal status: INITIAL   PLAN:  PT FREQUENCY: 2x/week  PT DURATION: 6 weeks  PLANNED INTERVENTIONS: [id number]- PT Re-evaluation, 97110-Therapeutic exercises, 97530- Therapeutic activity, 97112- Neuromuscular re-education, 97535- Self Care, [id number]- Manual therapy, 97760- Orthotic Initial, and [id number]- Orthotic/Prosthetic subsequent  PLAN FOR NEXT SESSION: review self MLD.  Pt to wear comfy wave garment in lieu of bandaging; shoulder ROM, MLD avoiding Lt axilla and chest wall, re-measure UE circumference weekly, consider DC, check goals    Grayce JINNY Sheldon, PT 09/22/2024 5:02 PM

## 2024-09-24 ENCOUNTER — Inpatient Hospital Stay: Payer: Medicare (Managed Care) | Attending: Hematology and Oncology

## 2024-09-24 ENCOUNTER — Inpatient Hospital Stay: Payer: Medicare (Managed Care) | Attending: Hematology and Oncology | Admitting: Hematology and Oncology

## 2024-09-24 VITALS — BP 149/64 | HR 89 | Temp 98.0°F | Resp 18 | Ht 61.0 in | Wt 120.2 lb

## 2024-09-24 DIAGNOSIS — R197 Diarrhea, unspecified: Secondary | ICD-10-CM | POA: Insufficient documentation

## 2024-09-24 DIAGNOSIS — Z79811 Long term (current) use of aromatase inhibitors: Secondary | ICD-10-CM | POA: Insufficient documentation

## 2024-09-24 DIAGNOSIS — R5383 Other fatigue: Secondary | ICD-10-CM | POA: Diagnosis not present

## 2024-09-24 DIAGNOSIS — C50812 Malignant neoplasm of overlapping sites of left female breast: Secondary | ICD-10-CM | POA: Diagnosis present

## 2024-09-24 DIAGNOSIS — Z79631 Long term (current) use of antimetabolite agent: Secondary | ICD-10-CM | POA: Diagnosis not present

## 2024-09-24 DIAGNOSIS — Z79899 Other long term (current) drug therapy: Secondary | ICD-10-CM | POA: Insufficient documentation

## 2024-09-24 DIAGNOSIS — Z170 Estrogen receptor positive status [ER+]: Secondary | ICD-10-CM | POA: Insufficient documentation

## 2024-09-24 DIAGNOSIS — Z1732 Human epidermal growth factor receptor 2 negative status: Secondary | ICD-10-CM | POA: Insufficient documentation

## 2024-09-24 DIAGNOSIS — Z1721 Progesterone receptor positive status: Secondary | ICD-10-CM | POA: Insufficient documentation

## 2024-09-24 DIAGNOSIS — C799 Secondary malignant neoplasm of unspecified site: Secondary | ICD-10-CM | POA: Insufficient documentation

## 2024-09-24 LAB — CMP (CANCER CENTER ONLY)
ALT: 18 U/L (ref 0–44)
AST: 34 U/L (ref 15–41)
Albumin: 4.2 g/dL (ref 3.5–5.0)
Alkaline Phosphatase: 43 U/L (ref 38–126)
Anion gap: 10 (ref 5–15)
BUN: 18 mg/dL (ref 8–23)
CO2: 27 mmol/L (ref 22–32)
Calcium: 10.3 mg/dL (ref 8.9–10.3)
Chloride: 101 mmol/L (ref 98–111)
Creatinine: 1.29 mg/dL — ABNORMAL HIGH (ref 0.44–1.00)
GFR, Estimated: 40 mL/min — ABNORMAL LOW (ref >60)
Glucose, Bld: 109 mg/dL — ABNORMAL HIGH (ref 70–99)
Potassium: 4 mmol/L (ref 3.5–5.1)
Sodium: 137 mmol/L (ref 135–145)
Total Bilirubin: 0.3 mg/dL (ref 0.0–1.2)
Total Protein: 8.2 g/dL — ABNORMAL HIGH (ref 6.5–8.1)

## 2024-09-24 LAB — CBC WITH DIFFERENTIAL (CANCER CENTER ONLY)
Abs Immature Granulocytes: 0.01 K/uL (ref 0.00–0.07)
Basophils Absolute: 0.1 K/uL (ref 0.0–0.1)
Basophils Relative: 1 %
Eosinophils Absolute: 0 K/uL (ref 0.0–0.5)
Eosinophils Relative: 0 %
HCT: 34.4 % — ABNORMAL LOW (ref 36.0–46.0)
Hemoglobin: 11.3 g/dL — ABNORMAL LOW (ref 12.0–15.0)
Immature Granulocytes: 0 %
Lymphocytes Relative: 32 %
Lymphs Abs: 1.1 K/uL (ref 0.7–4.0)
MCH: 28.8 pg (ref 26.0–34.0)
MCHC: 32.8 g/dL (ref 30.0–36.0)
MCV: 87.5 fL (ref 80.0–100.0)
Monocytes Absolute: 0.2 K/uL (ref 0.1–1.0)
Monocytes Relative: 7 %
Neutro Abs: 2.1 K/uL (ref 1.7–7.7)
Neutrophils Relative %: 60 %
Platelet Count: 188 K/uL (ref 150–400)
RBC: 3.93 MIL/uL (ref 3.87–5.11)
RDW: 15.9 % — ABNORMAL HIGH (ref 11.5–15.5)
WBC Count: 3.5 K/uL — ABNORMAL LOW (ref 4.0–10.5)
nRBC: 0 % (ref 0.0–0.2)

## 2024-09-24 NOTE — Assessment & Plan Note (Signed)
 12/05/2023:Patient presented with 2 palpable lumps in the left breast 10:00: 2.7 cm with skin tethering, 2:00: 4.2 cm, 3 masses in the left axilla possibly lymph nodes.  Biopsy of the first mass was IDC grade 2, ER 95%, PR 70%, Ki67 20%, HER2 negative; biopsy of second mass grade 3 IDC ER 70%, PR 60%, Ki67 90%, HER2 negative, biopsy of the lymph node grade 3 IDC    12/20/2023: Breast MRI: Left breast: 5.3 cm mass UOQ abuts pectoralis muscle and skin, multiple satellite nodules and non-mass enhancement (entire area 8.8 cm), 3.7 cm irregular mass UIQ abuts pectoralis muscle and skin, 3 cm mass left axilla, mild diffuse skin thickening, 3 lymph nodes enlarged   12/25/2023: CT CAP: Left breast masses and left axillary and subpectoral lymph nodes, multiple small bilateral pulmonary nodules highly suspicious for lung metastases (0.8 cm, 0.8 cm, 1 cm)   01/07/2024: Bone scan: Negative   Treatment plan: Neoadjuvant antiestrogen therapy with anastrozole  started April 2025, switched to letrozole  with Verzinio because of worsening cutaneous metastases on 08/27/2024   ----------------------------------------------------------------------------------------- 06/05/2024: Mammogram and ultrasound: Decrease in the masses in the left breast and axilla was 4.2 cm is now 3 cm 08/23/2024: CT chest: Stable left breast masses and skin thickening, mild increase in the left axillary lymphadenopathy and new mild left supraclavicular lymphadenopathy, stable bilateral pulmonary nodules  Verzinio toxicities:  Return to clinic in 1 month to see John. Scans will be done in 2 months.

## 2024-09-24 NOTE — Progress Notes (Signed)
 Patient Care Team: Verdia Lombard, MD as PCP - General (Internal Medicine) Tyree Nanetta SAILOR, RN as Oncology Nurse Navigator Odean Potts, MD as Consulting Physician (Hematology and Oncology) Dewey Rush, MD as Consulting Physician (Radiation Oncology) Aron Shoulders, MD as Consulting Physician (General Surgery)  DIAGNOSIS:  Encounter Diagnosis  Name Primary?   Malignant neoplasm of overlapping sites of left breast in female, estrogen receptor positive (HCC) Yes    SUMMARY OF ONCOLOGIC HISTORY: Oncology History  Malignant neoplasm of overlapping sites of left breast in female, estrogen receptor positive (HCC)  12/05/2023 Initial Diagnosis   Patient presented with 2 palpable lumps in the left breast 10:00: 2.7 cm with skin tethering, 2:00: 4.2 cm, 3 masses in the left axilla possibly lymph nodes.  Biopsy of the first mass was IDC grade 2, ER 95%, PR 70%, Ki67 20%, HER2 negative; biopsy of second mass grade 3 IDC ER 70%, PR 60%, Ki67 90%, HER2 negative, biopsy of the lymph node grade 3 IDC   12/11/2023 Cancer Staging   Staging form: Breast, AJCC 8th Edition - Clinical stage from 12/11/2023: Stage IIB (cT2, cN1, cM0, G3, ER+, PR+, HER2-) - Signed by Odean Potts, MD on 12/11/2023 Stage prefix: Initial diagnosis Histologic grading system: 3 grade system   01/02/2024 Genetic Testing   Negative [employer] CancerNext +RNAinsight Panel.  Report date is 01/02/2024.  The [employer] CancerNext+RNAinsight Panel includes sequencing, rearrangement analysis, and RNA analysis for the following 39 genes: APC, ATM, BAP1, BARD1, BMPR1A, BRCA1, BRCA2, BRIP1, CDH1, CDKN2A, CHEK2, FH, FLCN, MET, MLH1, MSH2, MSH6, MUTYH, NF1, NTHL1, PALB2, PMS2, PTEN, RAD51C, RAD51D, SMAD4, STK11, TP53, TSC1, TSC2, and VHL (sequencing and deletion/duplication); AXIN2, HOXB13, MBD4, MSH3, POLD1 and POLE (sequencing only); EPCAM and GREM1 (deletion/duplication only).   12/2023 -  Anti-estrogen oral therapy   Anastrozole  daily    01/20/2024 PET scan   PET/CT: Hypermetabolic left breast cancer, soft tissue nodule abutting left pectoralis major is smaller, several hypermetabolic nodes left axilla and supraclavicular are slightly smaller, left upper mediastinal lymph node, small lung nodules some appear to be smaller      CHIEF COMPLIANT: Follow-up of metastatic breast cancer on Verzinio  HISTORY OF PRESENT ILLNESS: Katelyn Fitzgerald is a 88 year old with above-mentioned history of metastatic breast cancer is currently on treatment with Verzenio  and letrozole .  She is tolerating the treatment extremely well.  She had only 1 episode of diarrhea.  Denies any nausea or vomiting.  Mild to moderate fatigue.    ALLERGIES:  has no known allergies.  MEDICATIONS:  Current Outpatient Medications  Medication Sig Dispense Refill   abemaciclib  (VERZENIO ) 100 MG tablet Take 1 tablet (100 mg total) by mouth 2 (two) times daily. (Patient not taking: Reported on 09/02/2024) 60 tablet 3   Calcium Carbonate-Vit D-Min (CALCIUM 1200 PO) Take 1,200 mg by mouth daily.     cholecalciferol (VITAMIN D) 1000 units tablet Take 1,000 Units by mouth daily.     denosumab  (PROLIA ) 60 MG/ML SOSY injection Inject 60 mg into the skin every 6 (six) months.     folic acid  (FOLVITE ) 1 MG tablet Take 1 tablet (1 mg total) by mouth daily.     letrozole  (FEMARA ) 2.5 MG tablet Take 1 tablet (2.5 mg total) by mouth daily. 90 tablet 3   lisinopril  (PRINIVIL ,ZESTRIL ) 10 MG tablet Take 10 mg by mouth daily.     methotrexate (RHEUMATREX) 2.5 MG tablet Take 17.5 mg by mouth once a week.     Multiple Vitamin (MULTIVITAMIN WITH MINERALS) TABS tablet Take  1 tablet by mouth daily.     Multiple Vitamins-Minerals (PRESERVISION AREDS 2+MULTI VIT) CAPS Take 1 capsule by mouth daily.     omega-3 acid ethyl esters (LOVAZA) 1 g capsule Take 1 g by mouth daily.     pantoprazole  (PROTONIX ) 40 MG tablet Take 1 tablet (40 mg total) by mouth 2 (two) times daily. 60 tablet 3   No current  facility-administered medications for this visit.    PHYSICAL EXAMINATION: ECOG PERFORMANCE STATUS: 1 - Symptomatic but completely ambulatory  Vitals:   09/24/24 1434  BP: (!) 149/64  Pulse: 89  Resp: 18  Temp: 98 F (36.7 C)  SpO2: 100%   Filed Weights   09/24/24 1434  Weight: 120 lb 3.2 oz (54.5 kg)      LABORATORY DATA:  I have reviewed the data as listed    Latest Ref Rng & Units 09/24/2024    2:13 PM 09/02/2024   12:37 PM 06/18/2024    5:37 AM  CMP  Glucose 70 - 99 mg/dL 890  898  96   BUN 8 - 23 mg/dL 18  19  11    Creatinine 0.44 - 1.00 mg/dL 8.70  9.00  9.18   Sodium 135 - 145 mmol/L 137  138  140   Potassium 3.5 - 5.1 mmol/L 4.0  4.2  4.0   Chloride 98 - 111 mmol/L 101  101  109   CO2 22 - 32 mmol/L 27  28  19    Calcium 8.9 - 10.3 mg/dL 89.6  89.3  8.8   Total Protein 6.5 - 8.1 g/dL 8.2  8.2    Total Bilirubin 0.0 - 1.2 mg/dL 0.3  0.3    Alkaline Phos 38 - 126 U/L 43  48    AST 15 - 41 U/L 34  30    ALT 0 - 44 U/L 18  14      Lab Results  Component Value Date   WBC 3.5 (L) 09/24/2024   HGB 11.3 (L) 09/24/2024   HCT 34.4 (L) 09/24/2024   MCV 87.5 09/24/2024   PLT 188 09/24/2024   NEUTROABS 2.1 09/24/2024    ASSESSMENT & PLAN:  Malignant neoplasm of overlapping sites of left breast in female, estrogen receptor positive (HCC) 12/05/2023:Patient presented with 2 palpable lumps in the left breast 10:00: 2.7 cm with skin tethering, 2:00: 4.2 cm, 3 masses in the left axilla possibly lymph nodes.  Biopsy of the first mass was IDC grade 2, ER 95%, PR 70%, Ki67 20%, HER2 negative; biopsy of second mass grade 3 IDC ER 70%, PR 60%, Ki67 90%, HER2 negative, biopsy of the lymph node grade 3 IDC    12/20/2023: Breast MRI: Left breast: 5.3 cm mass UOQ abuts pectoralis muscle and skin, multiple satellite nodules and non-mass enhancement (entire area 8.8 cm), 3.7 cm irregular mass UIQ abuts pectoralis muscle and skin, 3 cm mass left axilla, mild diffuse skin thickening, 3  lymph nodes enlarged   12/25/2023: CT CAP: Left breast masses and left axillary and subpectoral lymph nodes, multiple small bilateral pulmonary nodules highly suspicious for lung metastases (0.8 cm, 0.8 cm, 1 cm)   01/07/2024: Bone scan: Negative   Treatment plan: Neoadjuvant antiestrogen therapy with anastrozole  started April 2025, switched to letrozole  with Verzinio 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 Scans will be done in 2 months.        No orders of the defined types were placed in this encounter.  The patient has a good understanding of the overall plan. she agrees with it. she will call with any problems that may develop before the next visit here.  I personally spent a total of 30 minutes in the care of the patient today including preparing to see the patient, getting/reviewing separately obtained history, performing a medically appropriate exam/evaluation, counseling and educating, placing orders, referring and communicating with other health care professionals, documenting clinical information in the EHR, independently interpreting results, communicating results, and coordinating care.   Katelyn K Tyrene Nader, MD 09/24/2024

## 2024-09-25 ENCOUNTER — Ambulatory Visit: Payer: Medicare (Managed Care)

## 2024-09-25 DIAGNOSIS — I890 Lymphedema, not elsewhere classified: Secondary | ICD-10-CM

## 2024-09-25 DIAGNOSIS — C50812 Malignant neoplasm of overlapping sites of left female breast: Secondary | ICD-10-CM

## 2024-09-25 NOTE — Therapy (Addendum)
 " OUTPATIENT PHYSICAL THERAPY  UPPER EXTREMITY ONCOLOGY TREATMENT  Patient Name: Katelyn Fitzgerald MRN: [id number] DOB:October 15, 1935, 88 y.o., female Today's Date: 09/25/2024  END OF SESSION:  PT End of Session - 09/25/24 1056     Visit Number 8    Number of Visits 12    Date for Recertification  10/08/24    PT Start Time 1100    PT Stop Time 1155    PT Time Calculation (min) 55 min    Activity Tolerance Patient tolerated treatment well    Behavior During Therapy WFL for tasks assessed/performed           Past Medical History:  Diagnosis Date   Arthritis    Breast cancer (HCC)    Hypertension    Osteoporosis    Past Surgical History:  Procedure Laterality Date   BIOPSY OF SKIN SUBCUTANEOUS TISSUE AND/OR MUCOUS MEMBRANE  06/16/2024   Procedure: BIOPSY, SKIN, SUBCUTANEOUS TISSUE, OR MUCOUS MEMBRANE;  Surgeon: Rollin Dover, MD;  Location: [facility];  Service: Gastroenterology;;   BREAST BIOPSY Left 12/05/2023   US  LT BREAST BX W LOC DEV EA ADD LESION IMG BX SPEC US  GUIDE 12/05/2023 [facility]   BREAST BIOPSY Left 12/05/2023   US  LT BREAST BX W LOC DEV 1ST LESION IMG BX SPEC US  GUIDE 12/05/2023 [facility]   ESOPHAGOGASTRODUODENOSCOPY N/A 06/16/2024   Procedure: EGD (ESOPHAGOGASTRODUODENOSCOPY);  Surgeon: Rollin Dover, MD;  Location: [facility];  Service: Gastroenterology;  Laterality: N/A;   HEMOSTASIS CLIP PLACEMENT  06/16/2024   Procedure: CONTROL OF HEMORRHAGE, GI TRACT, ENDOSCOPIC, BY CLIPPING OR OVERSEWING;  Surgeon: Rollin Dover, MD;  Location: [facility];  Service: Gastroenterology;;   MATIAS  06/16/2024   Procedure: MATIAS;  Surgeon: Rollin Dover, MD;  Location: [facility];  Service: Gastroenterology;;   Patient Active Problem List   Diagnosis Date Noted   UGI bleed 06/15/2024   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   ABLA (acute blood loss anemia) 06/15/2024   Essential hypertension 06/01/2024   Osteoarthritis 06/01/2024   Osteoporosis  06/01/2024   Primary osteoarthritis involving multiple joints 06/01/2024   Stage 3a chronic kidney disease (HCC) 06/01/2024   Genetic testing 01/10/2024   Rheumatoid arthritis involving multiple sites (HCC) 12/11/2023   Methotrexate, long term, current use 12/11/2023   Malignant neoplasm of overlapping sites of left breast in female, estrogen receptor positive (HCC) 12/10/2023    PCP:   REFERRING PROVIDER: Dr. Mackey Chad  REFERRING DIAG: Left Breast Cancer/Left UE lymphedema  THERAPY DIAG:  Malignant neoplasm of overlapping sites of left female breast, unspecified estrogen receptor status (HCC)  Lymphedema, not elsewhere classified  ONSET DATE: 12/05/2023, swelling several weeks  Rationale for Evaluation and Treatment: Rehabilitation  SUBJECTIVE:  SUBJECTIVE STATEMENT:  I wore the sleeve all night without any problem. I am doing the shoulder exercises  EVAL: Pts daughter Harland present for evaluation. Arm started swelling 2 weeks ago.  She has some skin involvement now under the arm.. At night her arm hurts and she rubs it lightly which decreases the pain before she can go to sleep. Pain is always worse at night  PERTINENT HISTORY:  12/05/2023:Patient presented with 2 palpable lumps in the left breast 10:00: 2.7 cm with skin tethering, 2:00: 4.2 cm, 3 masses in the left axilla possibly lymph nodes. Biopsy of the first mass was IDC grade 2, ER 95%, PR 70%, Ki67 20%, HER2 negative; biopsy of second mass grade 3 IDC ER 70%, PR 60%, Ki67 90%, HER2 negative, biopsy of the lymph node grade 3 IDC. She was placed on Anastrozole  in March with April PET Scan showing nodules were smaller, however, Recent CT 08/20/2024 shows an increase in the size of LN's under her arm and skin changes with potential for ulceration.  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  PAIN:  Are you having pain? Not currently  PRECAUTIONS: OA, Breast Cancer (non surgical) affecting breast, LN's , hypertension, osteoporosis  RED FLAGS: None   WEIGHT BEARING RESTRICTIONS: No  FALLS:  Has patient fallen in last 6 months? Yes. Number of falls 1  LIVING ENVIRONMENT: Lives with: lives alone Lives in: House/apartment   OCCUPATION: NA  LEISURE: church, shopping, reading  HAND DOMINANCE: right   PRIOR LEVEL OF FUNCTION: Independent  PATIENT GOALS: Reduce swelling, reduce pain.   OBJECTIVE: Note: Objective measures were completed at Evaluation unless otherwise noted.  COGNITION: Overall cognitive status: Within functional limits for tasks assessed   PALPATION: +2 pitting edema hand, forearm, minimal in upper arm. Mild tenderness medial upper arm  OBSERVATIONS / OTHER ASSESSMENTS: left hand, forearm, and upper arm with significant swelling, orange peel, enlarged pores in forearm.  SENSATION: Light touch: Deficits    POSTURE: forward head, rounded shoulders  UPPER EXTREMITY AROM/PROM:  A/PROM RIGHT   eval   Shoulder extension   Shoulder flexion 138  Shoulder abduction 140  Shoulder internal rotation   Shoulder external rotation     (Blank rows = not tested)  A/PROM LEFT   eval LEFT 09/22/2024  Shoulder extension    Shoulder flexion 115 126  Shoulder abduction 105 115 with scaption, 105 True  Shoulder internal rotation    Shoulder external rotation      (Blank rows = not tested)  CERVICAL AROM: All within fxl limits:     UPPER EXTREMITY STRENGTH:   LYMPHEDEMA ASSESSMENTS:   SURGERY TYPE/DATE: NA  NUMBER OF LYMPH NODES REMOVED: NA  CHEMOTHERAPY: NO  RADIATION:No  HORMONE TREATMENT: Letrozole , Versenio  INFECTIONS: NO   LYMPHEDEMA ASSESSMENTS:   LANDMARK RIGHT  eval  At axilla  28.1  15 cm proximal to the proximal aspect of the  olecranon process 26  10 cm proximal to the proximal aspect of the olecranon process 24  Olecranon process 22.5  15 cm proximal to the proximal aspect of the ulnar styloid process 21.5  10 cm proximal to the proximal aspect of the ulnar styloid process 19  Just distal to the ulnar styloid process 14.7  Across hand at thumb web space 18.6  At base of 2nd digit 5.2  (Blank rows = not tested)  LANDMARK LEFT  eval Left 09/14/24 LEFT 09/22/2024 LEFT 09/25/2024  At axilla  30.7 31.3  15 cm proximal to the proximal aspect of the olecranon process 30.4 27.7 28.6   10 cm proximal to the proximal aspect of the olecranon process 30.8 28.7 29.1 28.6  Olecranon process 27.3 24 24.5   15 cm proximal to the proximal aspect of the ulnar styloid process 25.4 23.9 24.1 24  10  cm proximal to  the proximal aspect of the ulnar styloid process 22.6 21.4 21.2 20.7  Just distal to the ulnar styloid process 16.3 15.5 15.2   Across hand at thumb web space 20.0 18.1 18   At base of 2nd digit 6.1 5.4 5.4   (Blank rows = not tested)  Chest circumference just inferior to the axillae:  Chest circumference at the largest point:     FUNCTIONAL TESTS:    Lymphedema Life Impact Scale: 36%                                                                                                                           TREATMENT DATE:  09/25/2024 Therapeutic exercises Supine AAROM flex x5 Stargazer x 5 Manual Therapy: Measured circumference of left UE in several places PROM left shoulder flexion, abduction, IR and ER - VC for pt to relax arm throughout  Self Care:  reviewed self MLD in seated position. Abbreviated to: 5 deep breaths - did well after practicing a couple of times . Soul not get diaphragmatic breathing Rt axillary nodes, Lt inguinal nodes Lt UE (did not do medial to lateral due to ease) And then reversing all steps and ending with LN's Therapist also worked on arm briefly  Discussed that more time  in compression garments if comfortable will keep arm reduced better. Also discussed importance of washing garments so they maintain better. Pt and daughter Myra verbalized understanding 09/22/2024 Therapeutic exercises Supine AAROM flex x5 Stargazer x 5 Supine wand flex and scaption x 4 ea Manual Therapy: Measured circumference of left UE PROM left shoulder flexion, abduction, IR and ER - VC for pt to relax arm throughout  In supine: 5 diaphragmatic breaths, Rt axillary and pectoral nodes, then Lt UE working proximal to distal, moving fluid from lateral upper arm, then medial to lateral, and lateral arm upwards and doing both sides of forearm moving fluid then left hand repeating all steps spending extra time in any areas of fibrosis then retracing all steps and ending with pt in SL with Posterior interaxillary anastomosis and  right axillary LN's.  Self Care:  Edu on self MLD in seated. Abbreviated to: 5 deep breaths - did well after practicing a couple of times  Rt axillary nodes, Lt inguinal nodes Lt UE (did not do medial to lateral due to ease) And then reversing all steps   09/14/24: Therapeutic Exercises Pulleys into flex and abd x 2 mins each  Roll yellow ball up wall into flex and Lt scaption x 10 each  Doorway Pectoralis Stretch x 5 reps, 5-10 sec holds with cues to hold stretch  and adjust height of arms until stretch is comfortable Manual Therapy:  PROM left shoulder flexion, abduction, IR and ER - VC for pt to relax arm throughout  In supine: 5 diaphragmatic breaths, Rt axillary and pectoral nodes, then Lt UE working proximal to distal, moving fluid from lateral upper arm, then medial to lateral, and lateral arm upwards and doing both sides of forearm moving fluid then left hand repeating all steps spending extra time in any areas of fibrosis then retracing all steps and ending with right axillary LN's.  Self Care:  Edu on self MLD in seated. Abbreviated to: 5 deep breaths -  unable to achieve this despite lengthy vc and demo from PT and daughter.   Rt axillary nodes, Lt inguinal nodes Lt UE (did not do medial to lateral due to ease) And then reversing all steps  09/10/24 Therapeutic Exercise:  Patient & her daughter educated on the proper fit of the compression sleeve Observed patient don/doff compression sleeve independently Seated scap squeeze Supine shoulder AAROM with clasped hands x5 Supine stargazer stretch x 5 - mild increase in pain at end range Manual Therapy:  PROM left shoulder flexion, abduction, IR and ER - VC for pt to relax arm throughout  In supine: 5 diaphragmatic breaths, R axillary nodes, then L UE working proximal to distal, moving fluid from outer upper arm, then inner arm outwards, and outer arm upwards and right SL to posterior inter-axillary pathway and then supine doing both sides of forearm moving fluid  in Right SL to posterior inter-axillary pathway, then left hand repeating all steps spending extra time in any areas of fibrosis then retracing all steps and ending with right axillary LN's   09/08/2024 Discussed cost of glove and pts daughter wants to pay for it. Found glove on compression guru cheaper and pts daughter ordered it today. Pt educated in clasped hands shoulder flexion and stargazer stretch x 5 reps ea.  Updated HEP with pictures of exs above. PROM left shoulder flexion, abduction, IR and ER with VC to pt to relax,  In supine:  5 diaphragmatic breaths, R axillary nodes, then L UE working proximal to distal, moving fluid from outer upper arm, then inner arm outwards, and outer arm upwards and right SL to posterior inter-axillary pathway and then supine doing both sides of forearm moving fluid  in Right SL to posterior inter-axillary pathway, then left hand repeating all steps spending extra time in any areas of fibrosis then retracing all steps and ending with right axillary LN's New TG soft cut for pts arm to allow fold at top  so no rolling; pt prefers to wear it to wrist only as thumb hole was uncomfortable     PATIENT EDUCATION:  Education details: bandages vs sleeve, velcro garment vs bandages, Active CA and possible limitations with compression, LOS, treatment interventions Person educated: Patient and daughter Education method: Explanation Education comprehension: verbalized understanding  HOME EXERCISE PROGRAM:   ASSESSMENT:  CLINICAL IMPRESSION: Pt is able to return demonstrate exercises and MLD , but does require some cueing greatest for Manual lymph drainage. She feels like she is able to continue with these on her own.   OBJECTIVE IMPAIRMENTS: decreased activity tolerance, decreased knowledge of condition, decreased ROM, increased edema, impaired UE functional use, postural dysfunction, and pain.   ACTIVITY LIMITATIONS: lifting, sleeping, and reach over head  PARTICIPATION LIMITATIONS: driving  PERSONAL FACTORS: Age, Time since onset of injury/illness/exacerbation, and Active Cancer are also affecting patient's functional outcome.  REHAB POTENTIAL: Good  CLINICAL DECISION MAKING: Evolving/moderate complexity  EVALUATION COMPLEXITY: Moderate  GOALS: Goals reviewed with patient? Yes  SHORT TERM GOALS=LONG TERM GOALS: Target date: 10/08/2024  Pt will be independent with HEP for left shoulder ROM Baseline: Goal status: MET 09/25/2024  2.  Pt will have appropriate Compression to reduce/maintain swelling Baseline:  Goal status: MET 09/22/2024 3.  Pt will improve left shoulder AROM flex and abd by 15-20 degrees for improved reaching ability Baseline: 115, 105 Goal status: NOT MET  4.  Pt will have edema reduction by atleast 2 cm at 15 cm prox to olecranon Baseline:  Goal status: MET 09/14/2024  5.  Pt/daughter will be independent in self management of lymphedema Baseline:  Goal status: MET per pt  PLAN:  PT FREQUENCY: 2x/week  PT DURATION: 6 weeks  PLANNED INTERVENTIONS:  97164- PT Re-evaluation, 97110-Therapeutic exercises, 97530- Therapeutic activity, 97112- Neuromuscular re-education, 97535- Self Care, [id number]- Manual therapy, 97760- Orthotic Initial, and [id number]- Orthotic/Prosthetic subsequent  PLAN FOR NEXT SESSION: review self MLD.  Pt to wear comfy wave garment in lieu of bandaging; shoulder ROM, MLD avoiding Lt axilla and chest wall, re-measure UE circumference weekly, Pt on hold for 3 weeks if she needs to return. If no contact will DC  PHYSICAL THERAPY DISCHARGE SUMMARY  Visits from Start of Care: 8  Current functional level related to goals / functional outcomes: Partially achieved goals   Remaining deficits: Did not achieve ROM goal. Required some cueing for MLD   Education / Equipment: Comfy Wave sleeve and glove , ROM and MLD, use of compression for swelling  Patient agrees to discharge. Patient goals were partially met. Patient is being discharged due to no further needs identified at this time.  Grayce JINNY Sheldon, PT 09/25/2024 12:20 PM    "

## 2024-09-27 ENCOUNTER — Other Ambulatory Visit (HOSPITAL_COMMUNITY): Payer: Self-pay

## 2024-10-06 ENCOUNTER — Inpatient Hospital Stay: Payer: Medicare (Managed Care) | Admitting: Pharmacist

## 2024-10-06 ENCOUNTER — Inpatient Hospital Stay: Payer: Medicare (Managed Care)

## 2024-10-06 VITALS — BP 144/52 | HR 85 | Temp 98.0°F | Resp 17 | Wt 119.4 lb

## 2024-10-06 DIAGNOSIS — C50812 Malignant neoplasm of overlapping sites of left female breast: Secondary | ICD-10-CM | POA: Diagnosis not present

## 2024-10-06 DIAGNOSIS — Z170 Estrogen receptor positive status [ER+]: Secondary | ICD-10-CM

## 2024-10-06 LAB — CMP (CANCER CENTER ONLY)
ALT: 18 U/L (ref 0–44)
AST: 35 U/L (ref 15–41)
Albumin: 4.2 g/dL (ref 3.5–5.0)
Alkaline Phosphatase: 44 U/L (ref 38–126)
Anion gap: 11 (ref 5–15)
BUN: 14 mg/dL (ref 8–23)
CO2: 25 mmol/L (ref 22–32)
Calcium: 9.5 mg/dL (ref 8.9–10.3)
Chloride: 102 mmol/L (ref 98–111)
Creatinine: 1.15 mg/dL — ABNORMAL HIGH (ref 0.44–1.00)
GFR, Estimated: 46 mL/min — ABNORMAL LOW
Glucose, Bld: 100 mg/dL — ABNORMAL HIGH (ref 70–99)
Potassium: 4 mmol/L (ref 3.5–5.1)
Sodium: 138 mmol/L (ref 135–145)
Total Bilirubin: 0.3 mg/dL (ref 0.0–1.2)
Total Protein: 7.9 g/dL (ref 6.5–8.1)

## 2024-10-06 LAB — CBC WITH DIFFERENTIAL (CANCER CENTER ONLY)
Abs Immature Granulocytes: 0.01 K/uL (ref 0.00–0.07)
Basophils Absolute: 0.1 K/uL (ref 0.0–0.1)
Basophils Relative: 2 %
Eosinophils Absolute: 0 K/uL (ref 0.0–0.5)
Eosinophils Relative: 0 %
HCT: 33 % — ABNORMAL LOW (ref 36.0–46.0)
Hemoglobin: 11 g/dL — ABNORMAL LOW (ref 12.0–15.0)
Immature Granulocytes: 0 %
Lymphocytes Relative: 30 %
Lymphs Abs: 1.2 K/uL (ref 0.7–4.0)
MCH: 29.3 pg (ref 26.0–34.0)
MCHC: 33.3 g/dL (ref 30.0–36.0)
MCV: 87.8 fL (ref 80.0–100.0)
Monocytes Absolute: 0.3 K/uL (ref 0.1–1.0)
Monocytes Relative: 6 %
Neutro Abs: 2.4 K/uL (ref 1.7–7.7)
Neutrophils Relative %: 62 %
Platelet Count: 248 K/uL (ref 150–400)
RBC: 3.76 MIL/uL — ABNORMAL LOW (ref 3.87–5.11)
RDW: 17.4 % — ABNORMAL HIGH (ref 11.5–15.5)
WBC Count: 3.9 K/uL — ABNORMAL LOW (ref 4.0–10.5)
nRBC: 0 % (ref 0.0–0.2)

## 2024-10-06 NOTE — Progress Notes (Signed)
 [facility]        Telephone: [phone]?Fax: [phone]   Oncology Clinical Pharmacist Practitioner Progress Note   Katelyn Fitzgerald was contacted via in-person to discuss her chemotherapy regimen for abemaciclib  which they receive under the care of Dr. Vinay Gudena.  Current treatment regimen and start date Abemaciclib  (09/03/24) Letrozole  (08/27/24)  Interval History She continues on abemaciclib  100 mg by mouth every 12 hours on days 1 to 28 of a 28-day cycle. This is being given in combination with letrozole . Therapy is planned to continue until disease progression or unacceptable toxicity. She was last seen by Dr. Odean on 09/24/24 and clinical pharmacy on 09/02/24.  At her last visit with Dr. Odean, he recommended scans in 2 months  Response to Therapy She is doing well. No GI side effects reported. Her creatinine is coming down and her calcium is  now WNL x 2 visits. She continues to receive Prolia  through her PCP. Last given on Friday 10/02/24.  She will see Dr. Odean with labs prior in two weeks, and then clinical pharmacy again with labs prior in 4 weeks. May consider going up to abemaciclib  150 mg PO BID if continues to tolerate the current dose well.   Labs, vitals, treatment parameters, and manufacturer guidelines assessing toxicity were reviewed with Katelyn Fitzgerald today. Based on these values, patient is in agreement to continue abemaciclib  therapy at this time.  Allergies Allergies[1]  Vitals    10/06/2024   11:34 AM 09/24/2024    2:34 PM 09/02/2024   12:58 PM  Oncology Vitals  Height  155 cm   Weight 54.159 kg 54.522 kg 55.52 kg  Weight (lbs) 119 lbs 6 oz 120 lbs 3 oz 122 lbs 6 oz  BMI 22.56 kg/m2 22.71 kg/m2 23.13 kg/m2  Temp 98 F (36.7 C) 98 F (36.7 C) 97.2 F (36.2 C)  Pulse Rate 85 89 83  BP 144/52 149/64 142/68  Resp 17 18 18   SpO2 100 % 100 % 100 %  BSA (m2) 1.53 m2 1.53 m2 1.55 m2    Laboratory Data    Latest Ref Rng &  Units 10/06/2024   10:45 AM 09/24/2024    2:13 PM 09/02/2024   12:37 PM  CBC EXTENDED  WBC 4.0 - 10.5 K/uL 3.9  3.5  7.7   RBC 3.87 - 5.11 MIL/uL 3.76  3.93  4.19   Hemoglobin 12.0 - 15.0 g/dL 88.9  88.6  87.8   HCT 36.0 - 46.0 % 33.0  34.4  37.0   Platelets 150 - 400 K/uL 248  188  311   NEUT# 1.7 - 7.7 K/uL PENDING  2.1  5.3   Lymph# 0.7 - 4.0 K/uL PENDING  1.1  1.7        Latest Ref Rng & Units 10/06/2024   10:45 AM 09/24/2024    2:13 PM 09/02/2024   12:37 PM  CMP  Glucose 70 - 99 mg/dL 899  890  898   BUN 8 - 23 mg/dL 14  18  19    Creatinine 0.44 - 1.00 mg/dL 8.84  8.70  9.00   Sodium 135 - 145 mmol/L 138  137  138   Potassium 3.5 - 5.1 mmol/L 4.0  4.0  4.2   Chloride 98 - 111 mmol/L 102  101  101   CO2 22 - 32 mmol/L 25  27  28    Calcium 8.9 - 10.3 mg/dL 9.5  89.6  89.3   Total Protein  6.5 - 8.1 g/dL 7.9  8.2  8.2   Total Bilirubin 0.0 - 1.2 mg/dL 0.3  0.3  0.3   Alkaline Phos 38 - 126 U/L 44  43  48   AST 15 - 41 U/L 35  34  30   ALT 0 - 44 U/L 18  18  14      Adverse Effects Assessment Calcium: now WNL. Was elevated prior Creatinine: decreasing. Continue to encourage oral fluid intake and urine output monitoring by patient  Adherence Assessment Katelyn Fitzgerald reports missing 0 doses over the past 2 weeks.   Reason for missed dose: N/A Patient was re-educated on importance of adherence.   Access Assessment Katelyn Fitzgerald is currently receiving her abemaciclib  through [facility]  Insurance concerns:  None  Medication Reconciliation [name] medication list was reviewed today with the patient? Yes New medications or herbal supplements have recently been started? No  Any medications have been discontinued? No  The medication list was updated and reconciled based on the patient's most recent medication list in the electronic medical record (EMR) including herbal products and OTC medications.   Medications Current Outpatient Medications   Medication Sig Dispense Refill   abemaciclib  (VERZENIO ) 100 MG tablet Take 1 tablet (100 mg total) by mouth 2 (two) times daily. 60 tablet 3   cholecalciferol (VITAMIN D) 1000 units tablet Take 1,000 Units by mouth daily.     denosumab  (PROLIA ) 60 MG/ML SOSY injection Inject 60 mg into the skin every 6 (six) months.     folic acid  (FOLVITE ) 1 MG tablet Take 1 tablet (1 mg total) by mouth daily.     letrozole  (FEMARA ) 2.5 MG tablet Take 1 tablet (2.5 mg total) by mouth daily. 90 tablet 3   lisinopril  (PRINIVIL ,ZESTRIL ) 10 MG tablet Take 10 mg by mouth daily.     methotrexate (RHEUMATREX) 2.5 MG tablet Take 17.5 mg by mouth once a week.     Multiple Vitamin (MULTIVITAMIN WITH MINERALS) TABS tablet Take 1 tablet by mouth daily.     Multiple Vitamins-Minerals (PRESERVISION AREDS 2+MULTI VIT) CAPS Take 1 capsule by mouth daily.     omega-3 acid ethyl esters (LOVAZA) 1 g capsule Take 1 g by mouth daily.     pantoprazole  (PROTONIX ) 40 MG tablet Take 1 tablet (40 mg total) by mouth 2 (two) times daily. 60 tablet 3   Calcium Carbonate-Vit D-Min (CALCIUM 1200 PO) Take 1,200 mg by mouth daily. (Patient not taking: Reported on 10/06/2024)     No current facility-administered medications for this visit.   Drug-Drug Interactions (DDIs) DDIs were evaluated? Yes Significant DDIs? No  The patient was instructed to speak with their health care provider and/or the oral chemotherapy pharmacist before starting any new drug, including prescription or over the counter, natural / herbal products, or vitamins.  Supportive Care Diarrhea: we reviewed that diarrhea is common with abemaciclib  and confirmed that she does have loperamide (Imodium) at home.  We reviewed how to take this medication PRN. Neutropenia: we discussed the importance of having a thermometer and what the Centers for Disease Control and Prevention (CDC) considers a fever which is 100.63F (38C) or higher.  Gave patient 24/7 triage line to call if  any fevers or symptoms. ILD/Pneumonitis: we reviewed potential symptoms including cough, shortness, and fatigue.  VTE: reviewed signs of DVT such as leg swelling, redness, pain, or tenderness and signs of PE such as shortness of breath, rapid or irregular heartbeat, cough, chest pain, or lightheadedness. Reviewed  to take the medication every 12 hours (with food sometimes can be easier on the stomach) and to take it at the same time every day. Hepatotoxicity:WNL Drug interactions with grapefruit products  Dosing Assessment Hepatic adjustments needed? No  Renal adjustments needed? No  Toxicity adjustments needed? No  The current dosing regimen is appropriate to continue at this time.  Follow-Up Plan Continue abemaciclib  100 by mouth every 12 hours. Consider increasing dose if tolerating  current dose. Continue letrozole  2.5 mg by mouth daily Continue to hold calcium supplement. Okay to continue vitamin d and MDV with calcium. Calcium now WNL. Continue proper oral fluid intake. Creatinine decreasing. Continue denosumab  60 mg (Prolia ) SubQ every 6 months. Administered through PCP. Will add labs, Dr. Odean visit in 2 weeks. Dr. Gudena may order scans at that point. Started abemaciclib  on 09/03/24 so likely would be due at end of February Will add labs, pharmacy clinic visit in 4 weeks Katelyn Fitzgerald can follow up with clinical pharmacy as deemed necessary by Dr. Mackey Odean going forward   Katelyn Fitzgerald participated in the discussion, expressed understanding, and voiced agreement with the above plan. All questions were answered to her satisfaction. The patient was advised to contact the clinic at [phone] with any questions or concerns prior to her return visit.   I spent 30 minutes assessing and educating the patient.  Aira Sallade A. Fitzgerald, PharmD, BCOP, CPP  Katelyn Fitzgerald, RPH-CPP, 10/06/2024  11:48 AM   **Disclaimer: This note was dictated with voice recognition software. Similar  sounding words can inadvertently be transcribed and this note may contain transcription errors which may not have been corrected upon publication of note.**      [1] No Known Allergies

## 2024-10-20 ENCOUNTER — Inpatient Hospital Stay: Payer: Medicare (Managed Care) | Admitting: Hematology and Oncology

## 2024-10-20 ENCOUNTER — Inpatient Hospital Stay: Payer: Medicare (Managed Care) | Attending: Hematology and Oncology

## 2024-10-20 VITALS — BP 171/62 | HR 87 | Temp 97.4°F | Resp 18 | Wt 122.2 lb

## 2024-10-20 DIAGNOSIS — Z170 Estrogen receptor positive status [ER+]: Secondary | ICD-10-CM | POA: Insufficient documentation

## 2024-10-20 DIAGNOSIS — Z1732 Human epidermal growth factor receptor 2 negative status: Secondary | ICD-10-CM | POA: Diagnosis not present

## 2024-10-20 DIAGNOSIS — N644 Mastodynia: Secondary | ICD-10-CM | POA: Insufficient documentation

## 2024-10-20 DIAGNOSIS — C792 Secondary malignant neoplasm of skin: Secondary | ICD-10-CM | POA: Diagnosis not present

## 2024-10-20 DIAGNOSIS — Z79899 Other long term (current) drug therapy: Secondary | ICD-10-CM | POA: Insufficient documentation

## 2024-10-20 DIAGNOSIS — Z79631 Long term (current) use of antimetabolite agent: Secondary | ICD-10-CM | POA: Diagnosis not present

## 2024-10-20 DIAGNOSIS — Z1721 Progesterone receptor positive status: Secondary | ICD-10-CM | POA: Diagnosis not present

## 2024-10-20 DIAGNOSIS — G893 Neoplasm related pain (acute) (chronic): Secondary | ICD-10-CM | POA: Diagnosis not present

## 2024-10-20 DIAGNOSIS — C50812 Malignant neoplasm of overlapping sites of left female breast: Secondary | ICD-10-CM

## 2024-10-20 DIAGNOSIS — Z79811 Long term (current) use of aromatase inhibitors: Secondary | ICD-10-CM | POA: Diagnosis not present

## 2024-10-20 DIAGNOSIS — D492 Neoplasm of unspecified behavior of bone, soft tissue, and skin: Secondary | ICD-10-CM | POA: Insufficient documentation

## 2024-10-20 LAB — CBC WITH DIFFERENTIAL (CANCER CENTER ONLY)
Abs Immature Granulocytes: 0.01 K/uL (ref 0.00–0.07)
Basophils Absolute: 0.1 K/uL (ref 0.0–0.1)
Basophils Relative: 2 %
Eosinophils Absolute: 0 K/uL (ref 0.0–0.5)
Eosinophils Relative: 0 %
HCT: 32.9 % — ABNORMAL LOW (ref 36.0–46.0)
Hemoglobin: 11.1 g/dL — ABNORMAL LOW (ref 12.0–15.0)
Immature Granulocytes: 0 %
Lymphocytes Relative: 22 %
Lymphs Abs: 1 K/uL (ref 0.7–4.0)
MCH: 29.6 pg (ref 26.0–34.0)
MCHC: 33.7 g/dL (ref 30.0–36.0)
MCV: 87.7 fL (ref 80.0–100.0)
Monocytes Absolute: 0.3 K/uL (ref 0.1–1.0)
Monocytes Relative: 7 %
Neutro Abs: 3.2 K/uL (ref 1.7–7.7)
Neutrophils Relative %: 69 %
Platelet Count: 240 K/uL (ref 150–400)
RBC: 3.75 MIL/uL — ABNORMAL LOW (ref 3.87–5.11)
RDW: 19.2 % — ABNORMAL HIGH (ref 11.5–15.5)
WBC Count: 4.6 K/uL (ref 4.0–10.5)
nRBC: 0 % (ref 0.0–0.2)

## 2024-10-20 LAB — CMP (CANCER CENTER ONLY)
ALT: 16 U/L (ref 0–44)
AST: 34 U/L (ref 15–41)
Albumin: 4.2 g/dL (ref 3.5–5.0)
Alkaline Phosphatase: 49 U/L (ref 38–126)
Anion gap: 11 (ref 5–15)
BUN: 12 mg/dL (ref 8–23)
CO2: 25 mmol/L (ref 22–32)
Calcium: 9.5 mg/dL (ref 8.9–10.3)
Chloride: 100 mmol/L (ref 98–111)
Creatinine: 1.08 mg/dL — ABNORMAL HIGH (ref 0.44–1.00)
GFR, Estimated: 50 mL/min — ABNORMAL LOW
Glucose, Bld: 120 mg/dL — ABNORMAL HIGH (ref 70–99)
Potassium: 3.7 mmol/L (ref 3.5–5.1)
Sodium: 137 mmol/L (ref 135–145)
Total Bilirubin: 0.4 mg/dL (ref 0.0–1.2)
Total Protein: 8 g/dL (ref 6.5–8.1)

## 2024-10-20 MED ORDER — PANTOPRAZOLE SODIUM 40 MG PO TBEC: 40.0000 mg | DELAYED_RELEASE_TABLET | Freq: Every day | ORAL | Status: AC

## 2024-10-20 NOTE — Assessment & Plan Note (Signed)
 12/05/2023:Patient presented with 2 palpable lumps in the left breast 10:00: 2.7 cm with skin tethering, 2:00: 4.2 cm, 3 masses in the left axilla possibly lymph nodes.  Biopsy of the first mass was IDC grade 2, ER 95%, PR 70%, Ki67 20%, HER2 negative; biopsy of second mass grade 3 IDC ER 70%, PR 60%, Ki67 90%, HER2 negative, biopsy of the lymph node grade 3 IDC    12/20/2023: Breast MRI: Left breast: 5.3 cm mass UOQ abuts pectoralis muscle and skin, multiple satellite nodules and non-mass enhancement (entire area 8.8 cm), 3.7 cm irregular mass UIQ abuts pectoralis muscle and skin, 3 cm mass left axilla, mild diffuse skin thickening, 3 lymph nodes enlarged   12/25/2023: CT CAP: Left breast masses and left axillary and subpectoral lymph nodes, multiple small bilateral pulmonary nodules highly suspicious for lung metastases (0.8 cm, 0.8 cm, 1 cm)   01/07/2024: Bone scan: Negative   Treatment plan: Neoadjuvant antiestrogen therapy with anastrozole  started April 2025, switched to letrozole  with Verzinio because of worsening cutaneous metastases on 08/27/2024   ----------------------------------------------------------------------------------------- 06/05/2024: Mammogram and ultrasound: Decrease in the masses in the left breast and axilla was 4.2 cm is now 3 cm 08/23/2024: CT chest: Stable left breast masses and skin thickening, mild increase in the left axillary lymphadenopathy and new mild left supraclavicular lymphadenopathy, stable bilateral pulmonary nodules   Verzinio toxicities: Tolerating it extremely well denies any diarrhea   Return to clinic in 1 month with scans and follow-up

## 2024-10-20 NOTE — Progress Notes (Signed)
 "  Patient Care Team: Verdia Lombard, MD as PCP - General (Internal Medicine) Tyree Nanetta SAILOR, RN as Oncology Nurse Navigator Odean Potts, MD as Consulting Physician (Hematology and Oncology) Dewey Rush, MD as Consulting Physician (Radiation Oncology) Aron Shoulders, MD as Consulting Physician (General Surgery)  DIAGNOSIS:  Encounter Diagnosis  Name Primary?   Malignant neoplasm of overlapping sites of left breast in female, estrogen receptor positive (HCC) Yes    SUMMARY OF ONCOLOGIC HISTORY: Oncology History  Malignant neoplasm of overlapping sites of left breast in female, estrogen receptor positive (HCC)  12/05/2023 Initial Diagnosis   Patient presented with 2 palpable lumps in the left breast 10:00: 2.7 cm with skin tethering, 2:00: 4.2 cm, 3 masses in the left axilla possibly lymph nodes.  Biopsy of the first mass was IDC grade 2, ER 95%, PR 70%, Ki67 20%, HER2 negative; biopsy of second mass grade 3 IDC ER 70%, PR 60%, Ki67 90%, HER2 negative, biopsy of the lymph node grade 3 IDC   12/11/2023 Cancer Staging   Staging form: Breast, AJCC 8th Edition - Clinical stage from 12/11/2023: Stage IIB (cT2, cN1, cM0, G3, ER+, PR+, HER2-) - Signed by Odean Potts, MD on 12/11/2023 Stage prefix: Initial diagnosis Histologic grading system: 3 grade system   01/02/2024 Genetic Testing   Negative [facility] +RNAinsight Panel.  Report date is 01/02/2024.  The [facility] Cancer[employer]+RNAinsight Panel includes sequencing, rearrangement analysis, and RNA analysis for the following 39 genes: APC, ATM, BAP1, BARD1, BMPR1A, BRCA1, BRCA2, BRIP1, CDH1, CDKN2A, CHEK2, FH, FLCN, MET, MLH1, MSH2, MSH6, MUTYH, NF1, NTHL1, PALB2, PMS2, PTEN, RAD51C, RAD51D, SMAD4, STK11, TP53, TSC1, TSC2, and VHL (sequencing and deletion/duplication); AXIN2, HOXB13, MBD4, MSH3, POLD1 and POLE (sequencing only); EPCAM and GREM1 (deletion/duplication only).   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  She has a persistent left breast wound at the tumor site with recurring scabbing, separation, and oozing, currently managed with topical antibiotic ointment and gentle dressing. The wound causes significant pain, especially in the axilla, limiting use of her compression sleeve and affecting sleep position.  She has intermittent diarrhea, felt to be related to abemaciclib , without other gastrointestinal symptoms. She asks about urinary symptoms and whether her current therapy controls systemic disease, including the urinary tract. She stopped calcium supplementation before this visit.  Sep 24, 2024: Follow-up for metastatic ER+ breast cancer on letrozole  and Verzenio ; patient tolerating therapy well with mild fatigue and one episode of diarrhea. Imaging showed stable disease with mild progression of lymphadenopathy. ECOG performance status 1; scheduled for follow-up imaging in two months.     ALLERGIES:  has no known allergies.  MEDICATIONS:  Current Outpatient Medications  Medication Sig Dispense Refill   abemaciclib  (VERZENIO ) 100 MG tablet Take 1 tablet (100 mg total) by mouth 2 (two) times daily. 60 tablet 3   Calcium Carbonate-Vit D-Min (CALCIUM 1200 PO) Take 1,200 mg by mouth daily. (Patient not taking: Reported on 10/06/2024)  cholecalciferol (VITAMIN D) 1000 units tablet Take 1,000 Units by  mouth daily.     denosumab  (PROLIA ) 60 MG/ML SOSY injection Inject 60 mg into the skin every 6 (six) months.     folic acid  (FOLVITE ) 1 MG tablet Take 1 tablet (1 mg total) by mouth daily.     letrozole  (FEMARA ) 2.5 MG tablet Take 1 tablet (2.5 mg total) by mouth daily. 90 tablet 3   lisinopril  (PRINIVIL ,ZESTRIL ) 10 MG tablet Take 10 mg by mouth daily.     methotrexate (RHEUMATREX) 2.5 MG tablet Take 17.5 mg by mouth once a week.     Multiple Vitamin (MULTIVITAMIN WITH MINERALS) TABS tablet Take 1 tablet by mouth daily.     Multiple Vitamins-Minerals (PRESERVISION AREDS 2+MULTI VIT) CAPS Take 1 capsule by mouth daily.     omega-3 acid ethyl esters (LOVAZA) 1 g capsule Take 1 g by mouth daily.     pantoprazole  (PROTONIX ) 40 MG tablet Take 1 tablet (40 mg total) by mouth 2 (two) times daily. 60 tablet 3   No current facility-administered medications for this visit.    PHYSICAL EXAMINATION: ECOG PERFORMANCE STATUS: 1 - Symptomatic but completely ambulatory   Vitals:   10/20/24 0906 10/20/24 0909  BP: (!) 164/68 (!) 171/62  Pulse: 87   Resp: 18   Temp: (!) 97.4 F (36.3 C)   SpO2: 100%    Filed Weights   10/20/24 0906  Weight: 122 lb 3.2 oz (55.4 kg)    LABORATORY DATA:  I have reviewed the data as listed    Latest Ref Rng & Units 10/06/2024   10:45 AM 09/24/2024    2:13 PM 09/02/2024   12:37 PM  CMP  Glucose 70 - 99 mg/dL 899  890  898   BUN 8 - 23 mg/dL 14  18  19    Creatinine 0.44 - 1.00 mg/dL 8.84  8.70  9.00   Sodium 135 - 145 mmol/L 138  137  138   Potassium 3.5 - 5.1 mmol/L 4.0  4.0  4.2   Chloride 98 - 111 mmol/L 102  101  101   CO2 22 - 32 mmol/L 25  27  28    Calcium 8.9 - 10.3 mg/dL 9.5  89.6  89.3   Total Protein 6.5 - 8.1 g/dL 7.9  8.2  8.2   Total Bilirubin 0.0 - 1.2 mg/dL 0.3  0.3  0.3   Alkaline Phos 38 - 126 U/L 44  43  48   AST 15 - 41 U/L 35  34  30   ALT 0 - 44 U/L 18  18  14      Lab Results  Component Value Date   WBC 4.6 10/20/2024   HGB 11.1  (L) 10/20/2024   HCT 32.9 (L) 10/20/2024   MCV 87.7 10/20/2024   PLT 240 10/20/2024   NEUTROABS 3.2 10/20/2024    ASSESSMENT & PLAN:  Malignant neoplasm of overlapping sites of left breast in female, estrogen receptor positive (HCC) 12/05/2023:Patient presented with 2 palpable lumps in the left breast 10:00: 2.7 cm with skin tethering, 2:00: 4.2 cm, 3 masses in the left axilla possibly lymph nodes.  Biopsy of the first mass was IDC grade 2, ER 95%, PR 70%, Ki67 20%, HER2 negative; biopsy of second mass grade 3 IDC ER 70%, PR 60%, Ki67 90%, HER2 negative, biopsy of the lymph node grade 3 IDC    12/20/2023: Breast MRI: Left breast: 5.3 cm mass UOQ abuts pectoralis muscle and skin,  multiple satellite nodules and non-mass enhancement (entire area 8.8 cm), 3.7 cm irregular mass UIQ abuts pectoralis muscle and skin, 3 cm mass left axilla, mild diffuse skin thickening, 3 lymph nodes enlarged   12/25/2023: CT CAP: Left breast masses and left axillary and subpectoral lymph nodes, multiple small bilateral pulmonary nodules highly suspicious for lung metastases (0.8 cm, 0.8 cm, 1 cm)   01/07/2024: Bone scan: Negative   Treatment plan: Antiestrogen therapy with anastrozole  started April 2025, switched to letrozole  with Verzinio because of worsening cutaneous metastases on 08/27/2024   ----------------------------------------------------------------------------------------- 06/05/2024: Mammogram and ultrasound: Decrease in the masses in the left breast and axilla was 4.2 cm is now 3 cm 08/23/2024: CT chest: Stable left breast masses and skin thickening, mild increase in the left axillary lymphadenopathy and new mild left supraclavicular lymphadenopathy, stable bilateral pulmonary nodules   Verzinio toxicities: Tolerating it extremely well denies any diarrhea   Return to clinic in 1 month with scans and follow-up ------------------------------------- Assessment and Plan Assessment & Plan Estrogen receptor  positive malignant neoplasm of overlapping sites of the left breast Recurrent, 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 Disease well-managed on letrozole . Blood counts stable, no infection or cytopenias. Liver function tests pending, previously normal. Prognosis stable. - Continued letrozole  for systemic control. - Ordered interval imaging to assess disease status. - Continued monthly follow-up with blood counts and liver function monitoring. - Planned to review imaging results at next follow-up.  Skin breakdown associated with breast cancer Cutaneous metastases with a small, intermittently scabbing and oozing wound at the tumor site, causing pain and discomfort. Wound manageable, not infected, related to metastatic breast cancer. Pain interferes with compression sleeve use. Conservative wound care appropriate. - Continued application of topical antibiotic ointment to affected area. - Maintained gentle dressing over wound; used large bandages as needed. - Planned referral to wound care specialist if wound worsens or becomes unmanageable.      No orders of the defined types were placed in this encounter.  The patient has a good understanding of the overall plan. she agrees with it. she will call with any problems that may develop before the next visit here.  I personally spent a total of 30 minutes in the care of the patient today including preparing to see the patient, getting/reviewing separately obtained history, performing a medically appropriate exam/evaluation, counseling and educating, placing orders, referring and communicating with other health care professionals, documenting clinical information in the EHR, independently interpreting results, communicating results, and coordinating care.   Katelyn K Eulonda Andalon, MD 10/20/2024    "

## 2024-11-03 ENCOUNTER — Inpatient Hospital Stay: Payer: Medicare (Managed Care) | Admitting: Pharmacist

## 2024-11-03 ENCOUNTER — Inpatient Hospital Stay: Payer: Medicare (Managed Care)

## 2024-11-13 ENCOUNTER — Encounter: Payer: Self-pay | Admitting: Hematology and Oncology

## 2024-11-13 ENCOUNTER — Encounter: Payer: Self-pay | Admitting: *Deleted

## 2024-11-13 NOTE — Progress Notes (Signed)
 Received message from pt family stating left breast wound continues to ooze and requesting sooner f/u visit to discuss options.  Per MD pt needing to be seen by [facility].  Appt scheduled, pt notified and verbalized understanding.

## 2024-11-16 ENCOUNTER — Inpatient Hospital Stay: Payer: Medicare (Managed Care) | Attending: Hematology and Oncology | Admitting: Adult Health

## 2024-11-24 ENCOUNTER — Ambulatory Visit (HOSPITAL_COMMUNITY): Payer: Medicare (Managed Care)

## 2024-12-02 ENCOUNTER — Inpatient Hospital Stay: Payer: Medicare (Managed Care) | Admitting: Pharmacist

## 2024-12-02 ENCOUNTER — Inpatient Hospital Stay: Payer: Medicare (Managed Care)
